# Patient Record
Sex: Male | Born: 1980 | Race: Black or African American | Hispanic: No | Marital: Married | Smoking: Never smoker
Health system: Southern US, Community
[De-identification: ages and names within clinical notes are randomized; demographics above are authoritative.]

## PROBLEM LIST (undated history)

## (undated) DIAGNOSIS — J45909 Unspecified asthma, uncomplicated: Secondary | ICD-10-CM

## (undated) DIAGNOSIS — I10 Essential (primary) hypertension: Secondary | ICD-10-CM

## (undated) DIAGNOSIS — M1990 Unspecified osteoarthritis, unspecified site: Secondary | ICD-10-CM

## (undated) HISTORY — PX: OTHER SURGICAL HISTORY: SHX169

---

## 2001-05-30 ENCOUNTER — Inpatient Hospital Stay (HOSPITAL_COMMUNITY): Admission: EM | Admit: 2001-05-30 | Discharge: 2001-06-02 | Payer: Self-pay | Admitting: Emergency Medicine

## 2001-05-30 ENCOUNTER — Encounter (INDEPENDENT_AMBULATORY_CARE_PROVIDER_SITE_OTHER): Payer: Self-pay | Admitting: *Deleted

## 2001-05-30 ENCOUNTER — Encounter: Payer: Self-pay | Admitting: Emergency Medicine

## 2001-08-09 ENCOUNTER — Encounter: Admission: RE | Admit: 2001-08-09 | Discharge: 2001-08-09 | Payer: Self-pay | Admitting: Neurosurgery

## 2001-08-09 ENCOUNTER — Encounter: Payer: Self-pay | Admitting: Neurosurgery

## 2004-10-04 ENCOUNTER — Emergency Department (HOSPITAL_COMMUNITY): Admission: EM | Admit: 2004-10-04 | Discharge: 2004-10-05 | Payer: Self-pay | Admitting: Emergency Medicine

## 2004-10-06 ENCOUNTER — Emergency Department (HOSPITAL_COMMUNITY): Admission: EM | Admit: 2004-10-06 | Discharge: 2004-10-06 | Payer: Self-pay | Admitting: Emergency Medicine

## 2007-04-06 ENCOUNTER — Ambulatory Visit (HOSPITAL_COMMUNITY): Admission: EM | Admit: 2007-04-06 | Discharge: 2007-04-06 | Payer: Self-pay | Admitting: Emergency Medicine

## 2007-12-14 ENCOUNTER — Emergency Department (HOSPITAL_COMMUNITY): Admission: EM | Admit: 2007-12-14 | Discharge: 2007-12-14 | Payer: Self-pay | Admitting: Emergency Medicine

## 2011-02-04 ENCOUNTER — Emergency Department (HOSPITAL_COMMUNITY)
Admission: EM | Admit: 2011-02-04 | Discharge: 2011-02-04 | Disposition: A | Discharge status: Home / Self Care | Payer: Self-pay | Attending: Emergency Medicine | Admitting: Emergency Medicine

## 2011-02-04 ENCOUNTER — Emergency Department (HOSPITAL_COMMUNITY): Payer: Self-pay

## 2011-02-04 DIAGNOSIS — S9030XA Contusion of unspecified foot, initial encounter: Secondary | ICD-10-CM | POA: Insufficient documentation

## 2011-02-04 DIAGNOSIS — W2209XA Striking against other stationary object, initial encounter: Secondary | ICD-10-CM | POA: Insufficient documentation

## 2011-02-04 DIAGNOSIS — Y929 Unspecified place or not applicable: Secondary | ICD-10-CM | POA: Insufficient documentation
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
## 2011-08-10 LAB — BASIC METABOLIC PANEL
BUN: 9
Calcium: 9
Creatinine, Ser: 1.19
GFR calc Af Amer: >60

## 2011-08-10 LAB — URINALYSIS, ROUTINE W REFLEX MICROSCOPIC
Hgb urine dipstick: NEGATIVE
Protein, ur: NEGATIVE
Specific Gravity, Urine: 1.021
Urobilinogen, UA: 0.2

## 2011-08-10 LAB — ETHANOL: Alcohol, Ethyl (B): 163 — ABNORMAL HIGH

## 2011-08-10 LAB — RAPID URINE DRUG SCREEN, HOSP PERFORMED
Amphetamines: NOT DETECTED
Barbiturates: NOT DETECTED
Opiates: NOT DETECTED

## 2011-08-10 LAB — CBC
MCHC: 33.5
Platelets: 307
RBC: 5.29
WBC: 6.6

## 2011-08-10 LAB — DIFFERENTIAL
Basophils Relative: 0
Lymphs Abs: 1.3
Monocytes Relative: 7
Neutro Abs: 4.7
Neutrophils Relative %: 72

## 2013-10-09 ENCOUNTER — Ambulatory Visit: Payer: Self-pay

## 2015-12-24 ENCOUNTER — Emergency Department (HOSPITAL_COMMUNITY): Payer: Self-pay

## 2015-12-24 ENCOUNTER — Emergency Department (HOSPITAL_COMMUNITY)
Admission: EM | Admit: 2015-12-24 | Discharge: 2015-12-24 | Disposition: A | Discharge status: Home / Self Care | Payer: Self-pay | Attending: Emergency Medicine | Admitting: Emergency Medicine

## 2015-12-24 ENCOUNTER — Encounter (HOSPITAL_COMMUNITY): Payer: Self-pay | Admitting: Emergency Medicine

## 2015-12-24 DIAGNOSIS — Z79899 Other long term (current) drug therapy: Secondary | ICD-10-CM | POA: Insufficient documentation

## 2015-12-24 DIAGNOSIS — J45901 Unspecified asthma with (acute) exacerbation: Secondary | ICD-10-CM | POA: Insufficient documentation

## 2015-12-24 DIAGNOSIS — Z87891 Personal history of nicotine dependence: Secondary | ICD-10-CM | POA: Insufficient documentation

## 2015-12-24 LAB — I-STAT CHEM 8, ED
BUN: 15 mg/dL (ref 6–20)
CREATININE: 0.9 mg/dL (ref 0.61–1.24)
Calcium, Ion: 1.08 mmol/L — ABNORMAL LOW (ref 1.12–1.23)
Chloride: 104 mmol/L (ref 101–111)
GLUCOSE: 103 mg/dL — AB (ref 65–99)
HCT: 48 % (ref 39.0–52.0)
HEMOGLOBIN: 16.3 g/dL (ref 13.0–17.0)
Potassium: 3.5 mmol/L (ref 3.5–5.1)
Sodium: 141 mmol/L (ref 135–145)
TCO2: 23 mmol/L (ref 0–100)

## 2015-12-24 LAB — CBC WITH DIFFERENTIAL/PLATELET
Basophils Absolute: 0 10*3/uL (ref 0.0–0.1)
Basophils Relative: 1 %
Eosinophils Absolute: 0.6 10*3/uL (ref 0.0–0.7)
Eosinophils Relative: 11 %
HEMATOCRIT: 46.1 % (ref 39.0–52.0)
HEMOGLOBIN: 15.9 g/dL (ref 13.0–17.0)
LYMPHS ABS: 1.2 10*3/uL (ref 0.7–4.0)
LYMPHS PCT: 20 %
MCH: 30.1 pg (ref 26.0–34.0)
MCHC: 34.5 g/dL (ref 30.0–36.0)
MCV: 87.3 fL (ref 78.0–100.0)
MONO ABS: 0.9 10*3/uL (ref 0.1–1.0)
MONOS PCT: 16 %
NEUTROS ABS: 3 10*3/uL (ref 1.7–7.7)
Neutrophils Relative %: 52 %
Platelets: 230 10*3/uL (ref 150–400)
RBC: 5.28 MIL/uL (ref 4.22–5.81)
RDW: 13 % (ref 11.5–15.5)
WBC: 5.7 10*3/uL (ref 4.0–10.5)

## 2015-12-24 LAB — I-STAT TROPONIN, ED: Troponin i, poc: 0 ng/mL (ref 0.00–0.08)

## 2015-12-24 MED ORDER — ALBUTEROL SULFATE HFA 108 (90 BASE) MCG/ACT IN AERS: 2.0000 | INHALATION_SPRAY | Freq: Four times a day (QID) | RESPIRATORY_TRACT | Status: DC | PRN

## 2015-12-24 MED ORDER — ALBUTEROL SULFATE (2.5 MG/3ML) 0.083% IN NEBU
5.0000 mg | INHALATION_SOLUTION | Freq: Once | RESPIRATORY_TRACT | Status: AC
  Administered 2015-12-24: 5 mg via RESPIRATORY_TRACT
  Filled 2015-12-24: qty 6

## 2015-12-24 MED ORDER — HYDROCHLOROTHIAZIDE 12.5 MG PO CAPS: 12.5000 mg | ORAL_CAPSULE | Freq: Every day | ORAL | Status: DC

## 2015-12-24 MED ORDER — METHYLPREDNISOLONE SODIUM SUCC 125 MG IJ SOLR
125.0000 mg | Freq: Once | INTRAMUSCULAR | Status: DC
  Filled 2015-12-24: qty 2

## 2015-12-24 MED ORDER — PREDNISONE 20 MG PO TABS
60.0000 mg | ORAL_TABLET | Freq: Once | ORAL | Status: AC
  Administered 2015-12-24: 60 mg via ORAL
  Filled 2015-12-24: qty 3

## 2015-12-24 MED ORDER — ALBUTEROL SULFATE HFA 108 (90 BASE) MCG/ACT IN AERS
2.0000 | INHALATION_SPRAY | Freq: Four times a day (QID) | RESPIRATORY_TRACT | Status: DC | PRN
  Administered 2015-12-24: 2 via RESPIRATORY_TRACT
  Filled 2015-12-24: qty 6.7

## 2015-12-24 MED ORDER — PREDNISONE 50 MG PO TABS: ORAL_TABLET | ORAL | Status: DC

## 2015-12-24 MED ORDER — IPRATROPIUM BROMIDE 0.02 % IN SOLN
0.5000 mg | Freq: Once | RESPIRATORY_TRACT | Status: AC
  Administered 2015-12-24: 0.5 mg via RESPIRATORY_TRACT
  Filled 2015-12-24: qty 2.5

## 2015-12-24 NOTE — Discharge Instructions (Signed)
You were seen for a likely asthma exacerbation You will be prescribed albuterol to use as needed for shortness of breath and prednisone to take for a total of 5 days Please follow up with a PCP. You were given the information for the [facility] office You were also noted to have elevated blood pressure concerning for hypertension. You were started on Hydrochlorothiazide. Please take this daily. If you develop dizziness or headache after taking this medication, please stop it and follow with a PCP. This could mean your blood pressure is too low If you have worsening shortness of breath or wheezing, please return to the ED for evaluation

## 2015-12-24 NOTE — ED Notes (Signed)
Patient with URI for the last few days, patient states that he woke up with chest pain and shortness of breath.  Patient with audible wheezing, cough, nonproductive in nature.  Patient states that he has been wheezing for the last few days.  He has been using his son's nebs with some relief of the shortness of breath.

## 2015-12-24 NOTE — ED Provider Notes (Signed)
CSN: [id number]     Arrival date & time 12/24/15  0455 History   First MD Initiated Contact with Patient 12/24/15 [phone]     Chief Complaint  Patient presents with  . Chest Pain  . Shortness of Breath    HPI  35 y/o male presenting with non productive cough, shortness of breath for the last 4 days acutely worsening this AM This morning he awoke from sleep with difficlty breathing. He used his son's albuterol nebulizer which helped enough to stabilize him to come to the ED History noted anterior chest wall pain with deep inspiration and coughing during this time period as well. Pain occasionally radiates to back. His son was treated for the Flu approximately 3 weeks ago.  He denies a history of asthma but his mother and son both have asthma. He occasionally uses his son's albuterol    Denies fevers, chills, nausea, vomiting, diarrhea, abdominal pain.    History reviewed. No pertinent past medical history. Past Surgical History  Procedure Laterality Date  . Skull surgery     No family history on file. Social History  Substance Use Topics  . Smoking status: Former Games developer  . Smokeless tobacco: None  . Alcohol Use: Yes    Review of Systems Her HPI  Allergies  Review of patient's allergies indicates no known allergies.  Home Medications   Prior to Admission medications   Medication Sig Start Date End Date Taking? Authorizing Provider  albuterol (PROVENTIL) (2.5 MG/3ML) 0.083% nebulizer solution Take 2.5 mg by nebulization every 6 (six) hours as needed for wheezing or shortness of breath.   Yes Historical Provider, MD  Phenyleph-CPM-DM-APAP (ALKA-SELTZER PLUS COLD & COUGH) 02-21-09-325 MG CAPS Take 2 capsules by mouth daily as needed (cold).   Yes Historical Provider, MD  albuterol (PROVENTIL HFA;VENTOLIN HFA) 108 (90 Base) MCG/ACT inhaler Inhale 2 puffs into the lungs every 6 (six) hours as needed for wheezing or shortness of breath. 12/24/15   Chelsei Mcchesney A Kennon Rounds, MD  hydrochlorothiazide  (MICROZIDE) 12.5 MG capsule Take 1 capsule (12.5 mg total) by mouth daily. 12/24/15   Bonney Aid, MD  predniSONE (DELTASONE) 50 MG tablet Take daily for next 4 days 12/24/15   Markasia Carrol A Kashina Mecum, MD   BP 142/108 mmHg  Pulse 71  Temp(Src) 98.3 F (36.8 C) (Oral)  Resp 13  SpO2 97% Physical Exam  Constitutional: He appears well-developed and well-nourished.  HENT:  Head: Normocephalic.  Eyes: EOM are normal. Pupils are equal, round, and reactive to light.  Neck: Normal range of motion.  Cardiovascular: Normal rate, regular rhythm and normal heart sounds.   Pulmonary/Chest: Effort normal. No respiratory distress. He has wheezes. He has rales. He exhibits no tenderness.  Abdominal: Soft. Bowel sounds are normal. He exhibits no distension. There is no tenderness.  Neurological: He is alert.    ED Course  Procedures (including critical care time) Labs Review Labs Reviewed  I-STAT CHEM 8, ED - Abnormal; Notable for the following:    Glucose, Bld 103 (*)    Calcium, Ion 1.08 (*)    All other components within normal limits  CBC WITH DIFFERENTIAL/PLATELET  Rosezena Sensor, ED    Imaging Review Dg Chest 2 View  12/24/2015  CLINICAL DATA:  Acute onset of generalized chest pain, shortness of breath and wheezing. Cough. Initial encounter. EXAM: CHEST  2 VIEW COMPARISON:  Chest radiograph performed 12/14/2007 FINDINGS: The lungs are well-aerated and clear. There is no evidence of focal opacification, pleural effusion or pneumothorax.  The heart is normal in size; the mediastinal contour is within normal limits. No acute osseous abnormalities are seen. IMPRESSION: No acute cardiopulmonary process seen. Electronically Signed   By: Roanna RaiderJeffery  Chang M.D.   On: 12/24/2015 05:44   I have personally reviewed and evaluated these images and lab results as part of my medical decision-making.   EKG Interpretation   Date/Time:  Friday December 24 2015 05:03:17 EST Ventricular Rate:  72 PR Interval:  152 QRS  Duration: 92 QT Interval:  382 QTC Calculation: 418 R Axis:   81 Text Interpretation:  Sinus rhythm ST elev, probable normal early repol  pattern Confirmed by ZAVITZ  MD, JOSHUA ([id number]) on 12/24/2015 6:11:58 AM      MDM   Final diagnoses:  None   Shortness of breath and cough likely due to an acute asthma exacerbation in setting of potential viral URI. Chest XR negative for consolidation, afebrile making acute bacterial process less likely. Shortness of breath improved with albuterol, duonebs in the ED and prednisone. He was felt to be stable for discharge ad was sent with a prescription for prednisone to complete a 5 day course as well as albuterol.  His chest pain is likely musculoskeletal in nature and due to coughing. EKG was negative for evidence of ischemia and troponins were negative.  Blood pressure was noted to be elevated in the ED to a max to systolic 158 and diastolic 108. He had no symptoms or evidence of end organ damage. He was started on hydrochlorothiazide and counseled to follow up with a primary care provider.  Return precaution reviewed    Levante Simones A. Kennon RoundsHaney MD, MS Family Medicine Resident PGY-2 Pager [phone]     Bonney AidAlyssa A Tyne Banta, MD 12/24/15 [id number]  Blane OharaJoshua Zavitz, MD 12/25/15 0001

## 2017-04-13 ENCOUNTER — Emergency Department (HOSPITAL_COMMUNITY): Payer: Self-pay

## 2017-04-13 ENCOUNTER — Encounter (HOSPITAL_COMMUNITY): Payer: Self-pay

## 2017-04-13 ENCOUNTER — Emergency Department (HOSPITAL_COMMUNITY)
Admission: EM | Admit: 2017-04-13 | Discharge: 2017-04-14 | Disposition: A | Discharge status: Home / Self Care | Payer: Self-pay | Attending: Emergency Medicine | Admitting: Emergency Medicine

## 2017-04-13 DIAGNOSIS — Z79899 Other long term (current) drug therapy: Secondary | ICD-10-CM | POA: Insufficient documentation

## 2017-04-13 DIAGNOSIS — J4521 Mild intermittent asthma with (acute) exacerbation: Secondary | ICD-10-CM | POA: Insufficient documentation

## 2017-04-13 MED ORDER — ALBUTEROL SULFATE (2.5 MG/3ML) 0.083% IN NEBU
5.0000 mg | INHALATION_SOLUTION | Freq: Once | RESPIRATORY_TRACT | Status: AC
  Administered 2017-04-13: 5 mg via RESPIRATORY_TRACT

## 2017-04-13 MED ORDER — ALBUTEROL SULFATE (2.5 MG/3ML) 0.083% IN NEBU
INHALATION_SOLUTION | RESPIRATORY_TRACT | Status: AC
  Filled 2017-04-13: qty 6

## 2017-04-13 NOTE — ED Triage Notes (Signed)
Pt complaining of SOB and chest tightness. Pt states cough x 1 day. Pt denies any hx of asthma or COPD.

## 2017-04-14 LAB — CBC WITH DIFFERENTIAL/PLATELET
Basophils Absolute: 0 10*3/uL (ref 0.0–0.1)
Basophils Relative: 0 %
Eosinophils Absolute: 0 10*3/uL (ref 0.0–0.7)
Eosinophils Relative: 0 %
HEMATOCRIT: 49.6 % (ref 39.0–52.0)
HEMOGLOBIN: 16.8 g/dL (ref 13.0–17.0)
LYMPHS ABS: 0.5 10*3/uL — AB (ref 0.7–4.0)
Lymphocytes Relative: 5 %
MCH: 30.2 pg (ref 26.0–34.0)
MCHC: 33.9 g/dL (ref 30.0–36.0)
MCV: 89 fL (ref 78.0–100.0)
MONOS PCT: 3 %
Monocytes Absolute: 0.3 10*3/uL (ref 0.1–1.0)
NEUTROS ABS: 8.8 10*3/uL — AB (ref 1.7–7.7)
NEUTROS PCT: 92 %
Platelets: 232 10*3/uL (ref 150–400)
RBC: 5.57 MIL/uL (ref 4.22–5.81)
RDW: 12.9 % (ref 11.5–15.5)
WBC: 9.6 10*3/uL (ref 4.0–10.5)

## 2017-04-14 LAB — I-STAT TROPONIN, ED: TROPONIN I, POC: 0 ng/mL (ref 0.00–0.08)

## 2017-04-14 LAB — BASIC METABOLIC PANEL
Anion gap: 11 (ref 5–15)
BUN: 12 mg/dL (ref 6–20)
CHLORIDE: 100 mmol/L — AB (ref 101–111)
CO2: 22 mmol/L (ref 22–32)
CREATININE: 1.13 mg/dL (ref 0.61–1.24)
Calcium: 9.5 mg/dL (ref 8.9–10.3)
GFR calc non Af Amer: >60 mL/min (ref >60)
Glucose, Bld: 118 mg/dL — ABNORMAL HIGH (ref 65–99)
Potassium: 4.1 mmol/L (ref 3.5–5.1)
Sodium: 133 mmol/L — ABNORMAL LOW (ref 135–145)

## 2017-04-14 LAB — D-DIMER, QUANTITATIVE: D-Dimer, Quant: <0.27 ug/mL-FEU (ref 0.00–0.50)

## 2017-04-14 MED ORDER — PREDNISONE 20 MG PO TABS
60.0000 mg | ORAL_TABLET | Freq: Once | ORAL | Status: AC
  Administered 2017-04-14: 60 mg via ORAL
  Filled 2017-04-14: qty 3

## 2017-04-14 MED ORDER — ALBUTEROL SULFATE HFA 108 (90 BASE) MCG/ACT IN AERS
2.0000 | INHALATION_SPRAY | Freq: Once | RESPIRATORY_TRACT | Status: AC
  Administered 2017-04-14: 2 via RESPIRATORY_TRACT
  Filled 2017-04-14: qty 6.7

## 2017-04-14 MED ORDER — ACETAMINOPHEN 325 MG PO TABS
650.0000 mg | ORAL_TABLET | Freq: Once | ORAL | Status: AC
  Administered 2017-04-14: 650 mg via ORAL
  Filled 2017-04-14: qty 2

## 2017-04-14 MED ORDER — PREDNISONE 20 MG PO TABS: 40.0000 mg | ORAL_TABLET | Freq: Every day | ORAL | 0 refills | Status: DC

## 2017-04-14 NOTE — ED Provider Notes (Signed)
[facility]-EMERGENCY DEPT Provider Note   CSN: [id number] Arrival date & time: 04/13/17  2033     History   Chief Complaint Chief Complaint  Patient presents with  . Shortness of Breath    HPI William [name] is a 36 y.o. male.  HPI  36 year old male with a history of prior asthma presents with chest tightness and shortness of breath. He states he's had on and off short of breath for about one month. However the chest tightness and shortness of breath today were much more severe since this morning. He has a cough whenever he takes a deep breath. Was given a breathing treatment in the waiting room and states that short of breath is better but he still has some residual pain in the middle this chest. He denies any recent coughing besides with deep breath, no recent travel, surgery, or history of PE or DVT. No leg swelling or pain. He does not have an inhaler at home. Feels somewhat like an asthma exacerbation he had to come to the ER for last year.  History reviewed. No pertinent past medical history.  There are no active problems to display for this patient.   Past Surgical History:  Procedure Laterality Date  . skull surgery         Home Medications    Prior to Admission medications   Medication Sig Start Date End Date Taking? Authorizing Provider  albuterol (PROVENTIL HFA;VENTOLIN HFA) 108 (90 Base) MCG/ACT inhaler Inhale 2 puffs into the lungs every 6 (six) hours as needed for wheezing or shortness of breath. 12/24/15   Haney, Arlyss RepressAlyssa A, MD  albuterol (PROVENTIL) (2.5 MG/3ML) 0.083% nebulizer solution Take 2.5 mg by nebulization every 6 (six) hours as needed for wheezing or shortness of breath.    [provider]  hydrochlorothiazide (MICROZIDE) 12.5 MG capsule Take 1 capsule (12.5 mg total) by mouth daily. 12/24/15   Haney, Jeanann LewandowskyAlyssa A, MD  Phenyleph-CPM-DM-APAP (ALKA-SELTZER PLUS COLD & COUGH) 02-21-09-325 MG CAPS Take 2 capsules by mouth daily as needed (cold).    [provider]  predniSONE (DELTASONE) 50 MG tablet Take daily for next 4 days 12/24/15   Bonney AidHaney, Alyssa A, MD    Family History History reviewed. No pertinent family history.  Social History Social History  Substance Use Topics  . Smoking status: Former Games developermoker  . Smokeless tobacco: Never Used  . Alcohol use Yes     Allergies   Patient has no known allergies.   Review of Systems Review of Systems  Constitutional: Negative for fever.  Respiratory: Positive for cough, chest tightness and shortness of breath.   Cardiovascular: Negative for leg swelling.  All other systems reviewed and are negative.    Physical Exam Updated Vital Signs BP (!) 152/99   Pulse 93   Temp 99.3 F (37.4 C) (Oral)   Resp (!) 25   SpO2 94%   Physical Exam  Constitutional: He is oriented to person, place, and time. He appears well-developed and well-nourished. No distress.  HENT:  Head: Normocephalic and atraumatic.  Right Ear: External ear normal.  Left Ear: External ear normal.  Nose: Nose normal.  Eyes: Right eye exhibits no discharge. Left eye exhibits no discharge.  Neck: Neck supple.  Cardiovascular: Normal rate, regular rhythm and normal heart sounds.   Pulmonary/Chest: Effort normal and breath sounds normal. He has no wheezes. He exhibits no tenderness.  Abdominal: Soft. There is no tenderness.  Musculoskeletal: He exhibits no edema.  Neurological: He is  alert and oriented to person, place, and time.  Skin: Skin is warm and dry. He is not diaphoretic.  Nursing note and vitals reviewed.    ED Treatments / Results  Labs (all labs ordered are listed, but only abnormal results are displayed) Labs Reviewed  BASIC METABOLIC PANEL - Abnormal; Notable for the following:       Result Value   Sodium 133 (*)    Chloride 100 (*)    Glucose, Bld 118 (*)    All other components within normal limits  CBC WITH DIFFERENTIAL/PLATELET - Abnormal; Notable for the following:    Neutro Abs 8.8  (*)    Lymphs Abs 0.5 (*)    All other components within normal limits  D-DIMER, QUANTITATIVE (NOT AT [facility])  Rosezena Sensor, ED    EKG  EKG Interpretation  Date/Time:  Friday April 13 2017 20:57:45 EDT Ventricular Rate:  120 PR Interval:  134 QRS Duration: 76 QT Interval:  306 QTC Calculation: 432 R Axis:   91 Text Interpretation:  Sinus tachycardia Biatrial enlargement Rightward axis Pulmonary disease pattern Abnormal ECG rate is faster compared to 2017 Confirmed by Pricilla Loveless [phone]) on 04/14/2017 12:08:37 AM       Radiology Dg Chest 2 View  Result Date: 04/13/2017 CLINICAL DATA:  36 year old male with shortness of breath and cough. EXAM: CHEST  2 VIEW COMPARISON:  Chest radiograph dated 12/24/2015 FINDINGS: The heart size and mediastinal contours are within normal limits. Both lungs are clear. The visualized skeletal structures are unremarkable. IMPRESSION: No active cardiopulmonary disease. Electronically Signed   By: Elgie Collard M.D.   On: 04/13/2017 21:51    Procedures Procedures (including critical care time)  Medications Ordered in ED Medications  albuterol (PROVENTIL HFA;VENTOLIN HFA) 108 (90 Base) MCG/ACT inhaler 2 puff (not administered)  acetaminophen (TYLENOL) tablet 650 mg (not administered)  albuterol (PROVENTIL) (2.5 MG/3ML) 0.083% nebulizer solution 5 mg (5 mg Nebulization Given 04/13/17 2042)     Initial Impression / Assessment and Plan / ED Course  I have reviewed the triage vital signs and the nursing notes.  Pertinent labs & imaging results that were available during my care of the patient were reviewed by me and considered in my medical decision making (see chart for details).     Symptoms have resolved after tylenol/albuterol. Will give steroid burst. Most likely URI/asthma. Well appearing at this time. No wheezing on my exam but was already treated in triage. Workup otherwise unremarkable, no signs of PE, ACS, etc. F/u with PCP. Discussed  return precautions.  Final Clinical Impressions(s) / ED Diagnoses   Final diagnoses:  Mild intermittent asthma with exacerbation    New Prescriptions New Prescriptions   No medications on file     Pricilla Loveless, MD 04/14/17 1003

## 2017-08-22 ENCOUNTER — Emergency Department (HOSPITAL_COMMUNITY): Payer: Medicaid Other

## 2017-08-22 ENCOUNTER — Encounter (HOSPITAL_COMMUNITY): Payer: Self-pay | Admitting: *Deleted

## 2017-08-22 ENCOUNTER — Emergency Department (HOSPITAL_COMMUNITY)
Admission: EM | Admit: 2017-08-22 | Discharge: 2017-08-22 | Disposition: A | Discharge status: Home / Self Care | Payer: Medicaid Other | Attending: Emergency Medicine | Admitting: Emergency Medicine

## 2017-08-22 DIAGNOSIS — J45901 Unspecified asthma with (acute) exacerbation: Secondary | ICD-10-CM | POA: Diagnosis not present

## 2017-08-22 DIAGNOSIS — Z87891 Personal history of nicotine dependence: Secondary | ICD-10-CM | POA: Diagnosis not present

## 2017-08-22 DIAGNOSIS — R0602 Shortness of breath: Secondary | ICD-10-CM | POA: Diagnosis present

## 2017-08-22 LAB — CBC WITH DIFFERENTIAL/PLATELET
BASOS PCT: 1 %
Basophils Absolute: 0.1 10*3/uL (ref 0.0–0.1)
EOS PCT: 10 %
Eosinophils Absolute: 0.8 10*3/uL — ABNORMAL HIGH (ref 0.0–0.7)
HEMATOCRIT: 51 % (ref 39.0–52.0)
Hemoglobin: 17.9 g/dL — ABNORMAL HIGH (ref 13.0–17.0)
Lymphocytes Relative: 27 %
Lymphs Abs: 2.2 10*3/uL (ref 0.7–4.0)
MCH: 30.6 pg (ref 26.0–34.0)
MCHC: 35.1 g/dL (ref 30.0–36.0)
MCV: 87.2 fL (ref 78.0–100.0)
MONO ABS: 0.8 10*3/uL (ref 0.1–1.0)
MONOS PCT: 9 %
NEUTROS ABS: 4.5 10*3/uL (ref 1.7–7.7)
Neutrophils Relative %: 53 %
Platelets: 297 10*3/uL (ref 150–400)
RBC: 5.85 MIL/uL — ABNORMAL HIGH (ref 4.22–5.81)
RDW: 12.7 % (ref 11.5–15.5)
WBC: 8.3 10*3/uL (ref 4.0–10.5)

## 2017-08-22 LAB — BASIC METABOLIC PANEL
ANION GAP: 11 (ref 5–15)
BUN: 13 mg/dL (ref 6–20)
CALCIUM: 9.3 mg/dL (ref 8.9–10.3)
CO2: 23 mmol/L (ref 22–32)
Chloride: 103 mmol/L (ref 101–111)
Creatinine, Ser: 1.05 mg/dL (ref 0.61–1.24)
GFR calc Af Amer: >60 mL/min (ref >60)
GLUCOSE: 137 mg/dL — AB (ref 65–99)
Potassium: 3.9 mmol/L (ref 3.5–5.1)
Sodium: 137 mmol/L (ref 135–145)

## 2017-08-22 LAB — D-DIMER, QUANTITATIVE: D-Dimer, Quant: <0.27 ug/mL-FEU (ref 0.00–0.50)

## 2017-08-22 LAB — POCT I-STAT TROPONIN I: TROPONIN I, POC: 0 ng/mL (ref 0.00–0.08)

## 2017-08-22 MED ORDER — BENZONATATE 100 MG PO CAPS: 200.0000 mg | ORAL_CAPSULE | Freq: Three times a day (TID) | ORAL | 0 refills | Status: DC

## 2017-08-22 MED ORDER — IPRATROPIUM-ALBUTEROL 0.5-2.5 (3) MG/3ML IN SOLN
3.0000 mL | Freq: Once | RESPIRATORY_TRACT | Status: AC
  Administered 2017-08-22: 3 mL via RESPIRATORY_TRACT
  Filled 2017-08-22: qty 3

## 2017-08-22 MED ORDER — ACETAMINOPHEN 325 MG PO TABS
650.0000 mg | ORAL_TABLET | Freq: Once | ORAL | Status: AC
  Administered 2017-08-22: 650 mg via ORAL
  Filled 2017-08-22: qty 2

## 2017-08-22 MED ORDER — PREDNISONE 20 MG PO TABS
40.0000 mg | ORAL_TABLET | Freq: Once | ORAL | Status: AC
  Administered 2017-08-22: 40 mg via ORAL
  Filled 2017-08-22: qty 2

## 2017-08-22 MED ORDER — ALBUTEROL SULFATE (2.5 MG/3ML) 0.083% IN NEBU: 2.5000 mg | INHALATION_SOLUTION | Freq: Four times a day (QID) | RESPIRATORY_TRACT | 12 refills | Status: DC | PRN

## 2017-08-22 MED ORDER — ALBUTEROL SULFATE HFA 108 (90 BASE) MCG/ACT IN AERS
1.0000 | INHALATION_SPRAY | Freq: Once | RESPIRATORY_TRACT | Status: AC
  Administered 2017-08-22: 1 via RESPIRATORY_TRACT
  Filled 2017-08-22: qty 6.7

## 2017-08-22 MED ORDER — HYDROCHLOROTHIAZIDE 12.5 MG PO CAPS: 12.5000 mg | ORAL_CAPSULE | Freq: Every day | ORAL | 5 refills | Status: DC

## 2017-08-22 MED ORDER — ALBUTEROL SULFATE HFA 108 (90 BASE) MCG/ACT IN AERS: 1.0000 | INHALATION_SPRAY | Freq: Four times a day (QID) | RESPIRATORY_TRACT | 0 refills | Status: DC | PRN

## 2017-08-22 MED ORDER — ALBUTEROL SULFATE (2.5 MG/3ML) 0.083% IN NEBU
5.0000 mg | INHALATION_SOLUTION | Freq: Once | RESPIRATORY_TRACT | Status: AC
  Administered 2017-08-22: 5 mg via RESPIRATORY_TRACT
  Filled 2017-08-22: qty 6

## 2017-08-22 MED ORDER — PREDNISONE 10 MG PO TABS: 40.0000 mg | ORAL_TABLET | Freq: Every day | ORAL | 0 refills | Status: AC

## 2017-08-22 NOTE — ED Provider Notes (Signed)
[facility]-EMERGENCY DEPT Provider Note   CSN: [id number] Arrival date & time: 08/22/17  1312     History   Chief Complaint Chief Complaint  Patient presents with  . Shortness of Breath    HPI William [name] is a 36 y.o. male with a past medical history of asthma, presents to ED for evaluation of intermittent chest pain, shortness of breath, dry cough, wheezing for the past week. States that the chest pain is located all throughout his chest and worse with deep breathing. Described as sharp. This feels similar to his prior asthma exacerbations.  He states that he ran out of his albuterol inhaler several weeks ago.  She has not tried any over-the-counter medications to help.  He denies any hemoptysis, leg swelling, recent surgery, history of cancer, prior MI, DVT, PE, nausea, vomiting, abdominal pain, productive cough, fever or sick contacts.  HPI  History reviewed. No pertinent past medical history.  There are no active problems to display for this patient.   Past Surgical History:  Procedure Laterality Date  . skull surgery         Home Medications    Prior to Admission medications   Medication Sig Start Date End Date Taking? Authorizing Provider  Dextromethorphan Polistirex (ROBITUSSIN 12 HOUR COUGH PO) Take 30 mLs by mouth at bedtime as needed (cold symptoms).   Yes [provider]  albuterol (PROVENTIL HFA;VENTOLIN HFA) 108 (90 Base) MCG/ACT inhaler Inhale 1-2 puffs into the lungs every 6 (six) hours as needed for wheezing or shortness of breath. 08/22/17   Allayah Raineri, PA-C  albuterol (PROVENTIL) (2.5 MG/3ML) 0.083% nebulizer solution Take 3 mLs (2.5 mg total) by nebulization every 6 (six) hours as needed for wheezing or shortness of breath. 08/22/17   Siona Coulston, PA-C  benzonatate (TESSALON) 100 MG capsule Take 2 capsules (200 mg total) by mouth every 8 (eight) hours. 08/22/17   Shrinika Blatz, PA-C  hydrochlorothiazide (MICROZIDE) 12.5  MG capsule Take 1 capsule (12.5 mg total) by mouth daily. 08/22/17   Aditya Nastasi, PA-C  Phenyleph-CPM-DM-APAP (ALKA-SELTZER PLUS COLD & COUGH) 02-21-09-325 MG CAPS Take 2 capsules by mouth daily as needed (cold).    [provider]  predniSONE (DELTASONE) 10 MG tablet Take 4 tablets (40 mg total) by mouth daily. 08/22/17 08/26/17  Dietrich PatesKhatri, Jarelis Ehlert, PA-C    Family History No family history on file.  Social History Social History  Substance Use Topics  . Smoking status: Former Games developermoker  . Smokeless tobacco: Never Used  . Alcohol use Yes     Allergies   Patient has no known allergies.   Review of Systems Review of Systems  Constitutional: Negative for appetite change, chills and fever.  HENT: Positive for congestion and sore throat. Negative for ear pain, rhinorrhea and sneezing.   Eyes: Negative for photophobia and visual disturbance.  Respiratory: Positive for cough, chest tightness and shortness of breath. Negative for wheezing.   Cardiovascular: Positive for chest pain. Negative for palpitations.  Gastrointestinal: Negative for abdominal pain, blood in stool, constipation, diarrhea, nausea and vomiting.  Genitourinary: Negative for dysuria, hematuria and urgency.  Musculoskeletal: Negative for myalgias.  Skin: Negative for rash.  Neurological: Negative for dizziness, weakness and light-headedness.     Physical Exam Updated Vital Signs BP (!) 162/112 (BP Location: Right Arm)   Pulse 74   Temp 97.9 F (36.6 C) (Oral)   Resp 17   SpO2 95%   Physical Exam  Constitutional: He appears well-developed and well-nourished.  No distress.  Nontoxic-appearing and in no acute distress.  Does appear uncomfortable.  Able to speak in complete sentences.  HENT:  Head: Normocephalic and atraumatic.  Nose: Nose normal.  Mouth/Throat: Uvula is midline. No tonsillar exudate.  Patient does not appear to be in acute distress. No trismus or drooling present. No pooling of secretions.  Patient is tolerating secretions and is not in respiratory distress. No neck pain or tenderness to palpation of the neck. Full active and passive range of motion of the neck. No evidence of RPA or PTA.  Eyes: Conjunctivae and EOM are normal. Right eye exhibits no discharge. Left eye exhibits no discharge. No scleral icterus.  Neck: Normal range of motion. Neck supple.  Cardiovascular: Regular rhythm, normal heart sounds and intact distal pulses.  Tachycardia present.  Exam reveals no gallop and no friction rub.   No murmur heard. Pulmonary/Chest: Effort normal. No respiratory distress. He has wheezes in the right upper field, the right middle field, the right lower field, the left upper field, the left middle field and the left lower field.  Abdominal: Soft. Bowel sounds are normal. He exhibits no distension. There is no tenderness. There is no guarding.  Musculoskeletal: Normal range of motion. He exhibits no edema.  Neurological: He is alert. He exhibits normal muscle tone. Coordination normal.  Skin: Skin is warm and dry. No rash noted.  Psychiatric: He has a normal mood and affect.  Nursing note and vitals reviewed.    ED Treatments / Results  Labs (all labs ordered are listed, but only abnormal results are displayed) Labs Reviewed  BASIC METABOLIC PANEL - Abnormal; Notable for the following:       Result Value   Glucose, Bld 137 (*)    All other components within normal limits  CBC WITH DIFFERENTIAL/PLATELET - Abnormal; Notable for the following:    RBC 5.85 (*)    Hemoglobin 17.9 (*)    Eosinophils Absolute 0.8 (*)    All other components within normal limits  D-DIMER, QUANTITATIVE (NOT AT [facility])  I-STAT TROPONIN, ED  POCT I-STAT TROPONIN I    EKG  EKG Interpretation  Date/Time:  Wednesday August 22 2017 13:20:55 EDT Ventricular Rate:  88 PR Interval:    QRS Duration: 82 QT Interval:  348 QTC Calculation: 421 R Axis:   118 Text Interpretation:  Sinus rhythm Biatrial  enlargement Right ventricular hypertrophy Baseline wander in lead(s) V2 No significant change since last tracing Confirmed by Shaune Pollack [phone]) on 08/22/2017 6:58:12 PM       Radiology Dg Chest 2 View  Result Date: 08/22/2017 CLINICAL DATA:  One day of cough, shortness of breath, and chest tightness. Former smoker. No history of asthma or known diagnosis of COPD. EXAM: CHEST  2 VIEW COMPARISON:  PA and lateral chest x-ray of April 13, 2017 FINDINGS: The lungs are mildly hyperinflated and clear. The heart and pulmonary vascularity are normal. The mediastinum is normal in width. There is no pleural effusion. The bony thorax is unremarkable. IMPRESSION: Hyperinflation consistent with reactive airway disease or the patient's smoking history. No pneumonia, CHF, nor other acute cardiopulmonary abnormality. Electronically Signed   By: David  [place] M.D.   On: 08/22/2017 14:41    Procedures Procedures (including critical care time)  Medications Ordered in ED Medications  albuterol (PROVENTIL HFA;VENTOLIN HFA) 108 (90 Base) MCG/ACT inhaler 1 puff (not administered)  predniSONE (DELTASONE) tablet 40 mg (not administered)  albuterol (PROVENTIL) (2.5 MG/3ML) 0.083% nebulizer solution 5 mg (5 mg  Nebulization Given 08/22/17 1340)  ipratropium-albuterol (DUONEB) 0.5-2.5 (3) MG/3ML nebulizer solution 3 mL (3 mLs Nebulization Given 08/22/17 1856)  acetaminophen (TYLENOL) tablet 650 mg (650 mg Oral Given 08/22/17 1856)     Initial Impression / Assessment and Plan / ED Course  I have reviewed the triage vital signs and the nursing notes.  Pertinent labs & imaging results that were available during my care of the patient were reviewed by me and considered in my medical decision making (see chart for details).     Patient, with a past medical history of asthma who presents to ED with intermittent chest pain, shortness of breath, dry cough for the past week.  States that this does feel similar to his  prior asthma exacerbations.  He ran out of his albuterol inhaler several weeks ago.   Patient reports improvement in chest tightness with albuterol nebulizer treatment given prior to my evaluation.  He still states that he is having wheezing.  Tachycardic to low 100s during my exam.  Oxygen saturations 94-95% on room air. Will obtain labs, troponin and D-dimer due to symptoms and vitals.  Patient reports much improvement in his symptoms with DuoNeb given.  CBC, BMP, troponin unremarkable.  Chest x-ray shows signs of reactive airway disease.  D-dimer unremarkable.  EKG with tracing similar to previous.  Tachycardia and oxygen saturations improved with DuoNeb.  Patient continues to be hypertensive.  This could be partly due to his discomfort.  He also states that he has been more stressed, anxious due to beginning his new job.  Patient states that he does not feel like he needs to be admitted for his blood pressure.  States that he used to be on blood pressure medications but has not taken anything in the past year. Chart review reveals he was on HCTZ.  Will give first dose of prednisone and inhaler here.  He declines Decadron.  Will send home with steroid burst, hydrochlorothiazide, Tessalon Perles, refill for albuterol inhaler and nebulizer.  Advised to follow-up at [facility] for further evaluation.  Final Clinical Impressions(s) / ED Diagnoses   Final diagnoses:  Moderate asthma with exacerbation, unspecified whether persistent    New Prescriptions New Prescriptions   ALBUTEROL (PROVENTIL HFA;VENTOLIN HFA) 108 (90 BASE) MCG/ACT INHALER    Inhale 1-2 puffs into the lungs every 6 (six) hours as needed for wheezing or shortness of breath.   ALBUTEROL (PROVENTIL) (2.5 MG/3ML) 0.083% NEBULIZER SOLUTION    Take 3 mLs (2.5 mg total) by nebulization every 6 (six) hours as needed for wheezing or shortness of breath.   BENZONATATE (TESSALON) 100 MG CAPSULE    Take 2 capsules (200 mg total) by  mouth every 8 (eight) hours.   PREDNISONE (DELTASONE) 10 MG TABLET    Take 4 tablets (40 mg total) by mouth daily.     Dietrich Pates, PA-C 08/22/17 2049    Shaune Pollack, MD 08/23/17 1147

## 2017-08-22 NOTE — ED Triage Notes (Signed)
Pt complains of shortness of breath, cough, chest tightness for the past week. Pt has hx of asthma.

## 2017-08-22 NOTE — Discharge Instructions (Signed)
Please read the attached information regarding your condition. Take prednisone once daily starting tomorrow for 4 days. Take hydrochlorothiazide for blood pressure daily. Take Tessalon Perles as needed for cough. Use albuterol inhaler and nebulizer treatments as needed. Follow[facility]up at [facility] for further evaluation. Return to ED for worsening chest pain, increased shortness of breath, coughing up blood, fever, lightheadedness or loss of consciousness.

## 2017-09-06 ENCOUNTER — Emergency Department (HOSPITAL_COMMUNITY): Payer: Medicaid Other

## 2017-09-06 ENCOUNTER — Other Ambulatory Visit: Payer: Self-pay

## 2017-09-06 ENCOUNTER — Encounter (HOSPITAL_COMMUNITY): Payer: Self-pay

## 2017-09-06 ENCOUNTER — Inpatient Hospital Stay (HOSPITAL_COMMUNITY)
Admission: EM | Admit: 2017-09-06 | Discharge: 2017-09-08 | DRG: 202 | Disposition: A | Discharge status: Home / Self Care | Payer: Medicaid Other | Attending: Internal Medicine | Admitting: Internal Medicine

## 2017-09-06 DIAGNOSIS — Z23 Encounter for immunization: Secondary | ICD-10-CM

## 2017-09-06 DIAGNOSIS — Z87891 Personal history of nicotine dependence: Secondary | ICD-10-CM

## 2017-09-06 DIAGNOSIS — Z79899 Other long term (current) drug therapy: Secondary | ICD-10-CM

## 2017-09-06 DIAGNOSIS — I10 Essential (primary) hypertension: Secondary | ICD-10-CM | POA: Diagnosis present

## 2017-09-06 DIAGNOSIS — X58XXXA Exposure to other specified factors, initial encounter: Secondary | ICD-10-CM | POA: Diagnosis present

## 2017-09-06 DIAGNOSIS — J45902 Unspecified asthma with status asthmaticus: Secondary | ICD-10-CM

## 2017-09-06 DIAGNOSIS — J4532 Mild persistent asthma with status asthmaticus: Secondary | ICD-10-CM | POA: Diagnosis present

## 2017-09-06 DIAGNOSIS — B9789 Other viral agents as the cause of diseases classified elsewhere: Secondary | ICD-10-CM | POA: Diagnosis present

## 2017-09-06 DIAGNOSIS — E876 Hypokalemia: Secondary | ICD-10-CM | POA: Diagnosis not present

## 2017-09-06 DIAGNOSIS — Z888 Allergy status to other drugs, medicaments and biological substances status: Secondary | ICD-10-CM

## 2017-09-06 DIAGNOSIS — J9601 Acute respiratory failure with hypoxia: Secondary | ICD-10-CM | POA: Diagnosis not present

## 2017-09-06 DIAGNOSIS — T502X6A Underdosing of carbonic-anhydrase inhibitors, benzothiadiazides and other diuretics, initial encounter: Secondary | ICD-10-CM | POA: Diagnosis present

## 2017-09-06 DIAGNOSIS — Z91128 Patient's intentional underdosing of medication regimen for other reason: Secondary | ICD-10-CM

## 2017-09-06 DIAGNOSIS — Z7951 Long term (current) use of inhaled steroids: Secondary | ICD-10-CM

## 2017-09-06 HISTORY — DX: Essential (primary) hypertension: I10

## 2017-09-06 HISTORY — DX: Unspecified asthma, uncomplicated: J45.909

## 2017-09-06 LAB — I-STAT CHEM 8, ED
BUN: 14 mg/dL (ref 6–20)
CHLORIDE: 105 mmol/L (ref 101–111)
Calcium, Ion: 1.05 mmol/L — ABNORMAL LOW (ref 1.15–1.40)
Creatinine, Ser: 1 mg/dL (ref 0.61–1.24)
Glucose, Bld: 140 mg/dL — ABNORMAL HIGH (ref 65–99)
HEMATOCRIT: 48 % (ref 39.0–52.0)
Hemoglobin: 16.3 g/dL (ref 13.0–17.0)
POTASSIUM: 3.2 mmol/L — AB (ref 3.5–5.1)
SODIUM: 139 mmol/L (ref 135–145)
TCO2: 22 mmol/L (ref 22–32)

## 2017-09-06 LAB — CBC WITH DIFFERENTIAL/PLATELET
BASOS ABS: 0 10*3/uL (ref 0.0–0.1)
BASOS PCT: 0 %
EOS ABS: 0.2 10*3/uL (ref 0.0–0.7)
EOS PCT: 1 %
HCT: 44.6 % (ref 39.0–52.0)
HEMOGLOBIN: 14.9 g/dL (ref 13.0–17.0)
Lymphocytes Relative: 5 %
Lymphs Abs: 0.5 10*3/uL — ABNORMAL LOW (ref 0.7–4.0)
MCH: 29.6 pg (ref 26.0–34.0)
MCHC: 33.4 g/dL (ref 30.0–36.0)
MCV: 88.7 fL (ref 78.0–100.0)
MONOS PCT: 4 %
Monocytes Absolute: 0.5 10*3/uL (ref 0.1–1.0)
Neutro Abs: 10.3 10*3/uL — ABNORMAL HIGH (ref 1.7–7.7)
Neutrophils Relative %: 90 %
PLATELETS: 251 10*3/uL (ref 150–400)
RBC: 5.03 MIL/uL (ref 4.22–5.81)
RDW: 12.9 % (ref 11.5–15.5)
WBC: 11.4 10*3/uL — ABNORMAL HIGH (ref 4.0–10.5)

## 2017-09-06 MED ORDER — ENOXAPARIN SODIUM 40 MG/0.4ML ~~LOC~~ SOLN
40.0000 mg | Freq: Every day | SUBCUTANEOUS | Status: DC
  Filled 2017-09-06 (×2): qty 0.4

## 2017-09-06 MED ORDER — BUDESONIDE 0.25 MG/2ML IN SUSP
0.2500 mg | Freq: Two times a day (BID) | RESPIRATORY_TRACT | Status: DC
  Administered 2017-09-06 – 2017-09-08 (×4): 0.25 mg via RESPIRATORY_TRACT
  Filled 2017-09-06 (×5): qty 2

## 2017-09-06 MED ORDER — ACETAMINOPHEN 650 MG RE SUPP: 650.0000 mg | Freq: Four times a day (QID) | RECTAL | Status: DC | PRN

## 2017-09-06 MED ORDER — ALBUTEROL (5 MG/ML) CONTINUOUS INHALATION SOLN
10.0000 mg/h | INHALATION_SOLUTION | Freq: Once | RESPIRATORY_TRACT | Status: AC
  Administered 2017-09-06: 10 mg/h via RESPIRATORY_TRACT
  Filled 2017-09-06: qty 0.5

## 2017-09-06 MED ORDER — ALBUTEROL (5 MG/ML) CONTINUOUS INHALATION SOLN
10.0000 mg/h | INHALATION_SOLUTION | Freq: Once | RESPIRATORY_TRACT | Status: AC
  Administered 2017-09-06: 10 mg/h via RESPIRATORY_TRACT

## 2017-09-06 MED ORDER — BENZONATATE 100 MG PO CAPS
200.0000 mg | ORAL_CAPSULE | Freq: Three times a day (TID) | ORAL | Status: DC
  Administered 2017-09-07 – 2017-09-08 (×4): 200 mg via ORAL
  Filled 2017-09-06 (×4): qty 2

## 2017-09-06 MED ORDER — BENZONATATE 100 MG PO CAPS
100.0000 mg | ORAL_CAPSULE | Freq: Once | ORAL | Status: AC
  Administered 2017-09-06: 100 mg via ORAL
  Filled 2017-09-06: qty 1

## 2017-09-06 MED ORDER — ALBUTEROL SULFATE (2.5 MG/3ML) 0.083% IN NEBU
5.0000 mg | INHALATION_SOLUTION | Freq: Once | RESPIRATORY_TRACT | Status: AC
  Administered 2017-09-06: 5 mg via RESPIRATORY_TRACT
  Filled 2017-09-06: qty 6

## 2017-09-06 MED ORDER — BENZONATATE 100 MG PO CAPS: 200.0000 mg | ORAL_CAPSULE | Freq: Three times a day (TID) | ORAL | Status: DC

## 2017-09-06 MED ORDER — ALBUTEROL SULFATE HFA 108 (90 BASE) MCG/ACT IN AERS: 2.0000 | INHALATION_SPRAY | Freq: Four times a day (QID) | RESPIRATORY_TRACT | Status: DC | PRN

## 2017-09-06 MED ORDER — PREDNISONE 50 MG PO TABS
50.0000 mg | ORAL_TABLET | Freq: Every day | ORAL | Status: DC
  Administered 2017-09-07 – 2017-09-08 (×2): 50 mg via ORAL
  Filled 2017-09-06 (×2): qty 1

## 2017-09-06 MED ORDER — ALBUTEROL (5 MG/ML) CONTINUOUS INHALATION SOLN
10.0000 mg/h | INHALATION_SOLUTION | Freq: Once | RESPIRATORY_TRACT | Status: AC
  Administered 2017-09-06: 10 mg/h via RESPIRATORY_TRACT
  Filled 2017-09-06: qty 2

## 2017-09-06 MED ORDER — PREDNISONE 20 MG PO TABS
60.0000 mg | ORAL_TABLET | Freq: Once | ORAL | Status: AC
  Administered 2017-09-06: 60 mg via ORAL
  Filled 2017-09-06: qty 3

## 2017-09-06 MED ORDER — ACETAMINOPHEN 325 MG PO TABS
650.0000 mg | ORAL_TABLET | Freq: Four times a day (QID) | ORAL | Status: DC | PRN
  Administered 2017-09-07: 650 mg via ORAL
  Filled 2017-09-06: qty 2

## 2017-09-06 MED ORDER — IPRATROPIUM BROMIDE 0.02 % IN SOLN
0.5000 mg | Freq: Once | RESPIRATORY_TRACT | Status: AC
  Administered 2017-09-06: 0.5 mg via RESPIRATORY_TRACT
  Filled 2017-09-06: qty 2.5

## 2017-09-06 MED ORDER — ALBUTEROL SULFATE (2.5 MG/3ML) 0.083% IN NEBU
2.5000 mg | INHALATION_SOLUTION | RESPIRATORY_TRACT | Status: DC | PRN
  Administered 2017-09-06 – 2017-09-07 (×2): 2.5 mg via RESPIRATORY_TRACT
  Filled 2017-09-06 (×2): qty 3

## 2017-09-06 MED ORDER — ONDANSETRON HCL 4 MG/2ML IJ SOLN: 4.0000 mg | Freq: Four times a day (QID) | INTRAMUSCULAR | Status: DC | PRN

## 2017-09-06 MED ORDER — ONDANSETRON HCL 4 MG PO TABS: 4.0000 mg | ORAL_TABLET | Freq: Four times a day (QID) | ORAL | Status: DC | PRN

## 2017-09-06 MED ORDER — SODIUM CHLORIDE 0.9 % IV BOLUS (SEPSIS)
1000.0000 mL | Freq: Once | INTRAVENOUS | Status: AC
  Administered 2017-09-06: 1000 mL via INTRAVENOUS

## 2017-09-06 MED ORDER — ALBUTEROL (5 MG/ML) CONTINUOUS INHALATION SOLN
INHALATION_SOLUTION | RESPIRATORY_TRACT | Status: AC
  Filled 2017-09-06: qty 2

## 2017-09-06 MED ORDER — AMLODIPINE BESYLATE 5 MG PO TABS: 2.5000 mg | ORAL_TABLET | Freq: Every day | ORAL | Status: DC

## 2017-09-06 MED ORDER — MAGNESIUM SULFATE 2 GM/50ML IV SOLN
2.0000 g | Freq: Once | INTRAVENOUS | Status: AC
  Administered 2017-09-06: 2 g via INTRAVENOUS
  Filled 2017-09-06: qty 50

## 2017-09-06 MED ORDER — ALBUTEROL SULFATE (2.5 MG/3ML) 0.083% IN NEBU: 2.5000 mg | INHALATION_SOLUTION | Freq: Four times a day (QID) | RESPIRATORY_TRACT | Status: DC | PRN

## 2017-09-06 NOTE — Progress Notes (Signed)
Pt has arrived to 5w03, wife at the bedside. Pt identified appropriately, alert and oriented x 4, VS stable, no signs of acute distress. Wheezing auscultated on assessment, PRN breathing treatment administered, pt verbalized improvement. Continuous pulse ox in place. Pt independent, instructed to call for assistance, oriented to room and equipment. Will continue to monitor and treat pt per MD orders.

## 2017-09-06 NOTE — ED Provider Notes (Signed)
[facility] EMERGENCY DEPARTMENT Provider Note   CSN: [id number] Arrival date & time: 09/06/17  1533     History   Chief Complaint Chief Complaint  Patient presents with  . Asthma    HPI William [name] is a 36 y.o. male.  HPI   36 year old male with history of asthma and hypertension presenting for evaluation of shortness of breath.  Patient report for the past 2 days he has had generalized body aches, nonproductive cough, wheezes, shortness of breath.  Symptoms felt similar to prior asthma exacerbation.  He does have a rescue inhaler which he has been using every 15 minutes throughout the day today without adequate relief.  He is concerned for potential pneumonia.  He denies any recent sick contact.  He denies abusing tobacco.  No recent travel, no prior history of PE or DVT, no focal localized leg swelling or calf pain.  Denies having fever chills, runny nose, sneezing, sore throat or ear pain or headache.  No pleuritic chest pain.  Past Medical History:  Diagnosis Date  . Asthma   . Hypertension     There are no active problems to display for this patient.   Past Surgical History:  Procedure Laterality Date  . skull surgery         Home Medications    Prior to Admission medications   Medication Sig Start Date End Date Taking? Authorizing Provider  albuterol (PROVENTIL HFA;VENTOLIN HFA) 108 (90 Base) MCG/ACT inhaler Inhale 1-2 puffs into the lungs every 6 (six) hours as needed for wheezing or shortness of breath. 08/22/17   Khatri, Hina, PA-C  albuterol (PROVENTIL) (2.5 MG/3ML) 0.083% nebulizer solution Take 3 mLs (2.5 mg total) by nebulization every 6 (six) hours as needed for wheezing or shortness of breath. 08/22/17   Khatri, Hina, PA-C  benzonatate (TESSALON) 100 MG capsule Take 2 capsules (200 mg total) by mouth every 8 (eight) hours. 08/22/17   Khatri, Hina, PA-C  hydrochlorothiazide (MICROZIDE) 12.5 MG capsule Take 1 capsule (12.5 mg total) by  mouth daily. 08/22/17   Khatri, Hina, PA-C  Phenyleph-CPM-DM-APAP (ALKA-SELTZER PLUS COLD & COUGH) 02-21-09-325 MG CAPS Take 2 capsules by mouth daily as needed (cold).    [provider]    Family History History reviewed. No pertinent family history.  Social History Social History   Tobacco Use  . Smoking status: Former Games developer  . Smokeless tobacco: Never Used  Substance Use Topics  . Alcohol use: Yes  . Drug use: Yes    Types: Marijuana     Allergies   Patient has no known allergies.   Review of Systems Review of Systems  All other systems reviewed and are negative.    Physical Exam Updated Vital Signs BP (!) 162/110 (BP Location: Left Arm)   Pulse (!) 108   Temp 98 F (36.7 C) (Oral)   Resp 18   SpO2 90%   Physical Exam  Constitutional: He appears well-developed and well-nourished. No distress.  HENT:  Head: Atraumatic.  Ears: Cerumen impaction to both ears obstructing TM Nose: Normal nares Throat: Uvula is midline no tonsillar enlargement or exudates, no trismus  Eyes: Conjunctivae are normal.  Neck: Normal range of motion. Neck supple. No tracheal deviation present.  Cardiovascular: Intact distal pulses.  Mild tachycardia without murmur rubs or gallops  Pulmonary/Chest: He has wheezes.  Tachypneic, with inspiratory and expiratory wheezes without rales or rhonchi.  Accessory muscle use  Abdominal: Soft. He exhibits no distension. There is no  tenderness.  Musculoskeletal: He exhibits no edema.  Lymphadenopathy:    He has no cervical adenopathy.  Neurological: He is alert.  Skin: No rash noted.  Psychiatric: He has a normal mood and affect.  Nursing note and vitals reviewed.    ED Treatments / Results  Labs (all labs ordered are listed, but only abnormal results are displayed) Labs Reviewed  RESPIRATORY PANEL BY PCR  CBC WITH DIFFERENTIAL/PLATELET  I-STAT CHEM 8, ED    EKG  EKG Interpretation None       Radiology Dg Chest 2  View  Result Date: 09/06/2017 CLINICAL DATA:  Chest pain and shortness of Breath EXAM: CHEST  2 VIEW COMPARISON:  08/22/2017 FINDINGS: Melene MullerShadow is within normal limits. The lungs are again hyperinflated but stable. No focal infiltrate or sizable effusion is seen. No acute bony abnormality is noted. IMPRESSION: Persistent hyperinflation consistent with the given clinical history. No acute abnormality is noted. Electronically Signed   By: Alcide CleverMark  Lukens M.D.   On: 09/06/2017 16:21    Procedures Procedures (including critical care time)  Medications Ordered in ED Medications  albuterol (PROVENTIL, VENTOLIN) (5 MG/ML) 0.5% continuous inhalation solution (not administered)  albuterol (PROVENTIL) (2.5 MG/3ML) 0.083% nebulizer solution 5 mg (5 mg Nebulization Given 09/06/17 1547)  predniSONE (DELTASONE) tablet 60 mg (60 mg Oral Given 09/06/17 1649)  benzonatate (TESSALON) capsule 100 mg (100 mg Oral Given 09/06/17 1649)  albuterol (PROVENTIL,VENTOLIN) solution continuous neb (10 mg/hr Nebulization Given 09/06/17 1643)  ipratropium (ATROVENT) nebulizer solution 0.5 mg (0.5 mg Nebulization Given 09/06/17 1643)  albuterol (PROVENTIL,VENTOLIN) solution continuous neb (10 mg/hr Nebulization Given 09/06/17 1748)  magnesium sulfate IVPB 2 g 50 mL (2 g Intravenous New Bag/Given 09/06/17 1802)  sodium chloride 0.9 % bolus 1,000 mL (1,000 mLs Intravenous New Bag/Given 09/06/17 1802)     Initial Impression / Assessment and Plan / ED Course  I have reviewed the triage vital signs and the nursing notes.  Pertinent labs & imaging results that were available during my care of the patient were reviewed by me and considered in my medical decision making (see chart for details).     BP (!) 148/94   Pulse (!) 103   Temp 98 F (36.7 C) (Oral)   Resp 15   SpO2 94%    Final Clinical Impressions(s) / ED Diagnoses   Final diagnoses:  Asthma with status asthmaticus, unspecified asthma severity, unspecified  whether persistent    ED Discharge Orders    None     4:43 PM Patient here for complaints of increased shortness of breath and wheezing consistent with an asthma exacerbation.  Chest x-ray today without focal infiltrate concerning for pneumonia.  He is afebrile.  He will benefit from prednisone, continuous nebulizer, cough medication.  5:46 PM Despite receiving continuous albuterol nebs at 10mg /hr, and prednisone 60mg , no improvement of lung function.  Will continue with breathing treatment, give magnesium sulfate, IVF and continue to monitor.    7:45 PM Pt has received two continuous CAT without much improvement.  Has accessory muscle use.  Sats at 92 on RA while at rest.  He also received mag sulfate without relief.  Will initiate BiPAP, obtain labs.  Pt sign out to oncoming provider who will monitor and admit pt for further management of his asthma exacerbation.    CRITICAL CARE Performed by: Fayrene HelperBowie Bettey Muraoka Total critical care time: 35 minutes Critical care time was exclusive of separately billable procedures and treating other patients. Critical care was necessary to treat  or prevent imminent or life-threatening deterioration. Critical care was time spent personally by me on the following activities: development of treatment plan with patient and/or surrogate as well as nursing, discussions with consultants, evaluation of patient's response to treatment, examination of patient, obtaining history from patient or surrogate, ordering and performing treatments and interventions, ordering and review of laboratory studies, ordering and review of radiographic studies, pulse oximetry and re-evaluation of patient's condition.     Fayrene Helperran, Aaliya Maultsby, PA-C 09/06/17 Malvin Johns2003    Schlossman, Erin, MD 09/09/17 1757

## 2017-09-06 NOTE — H&P (Addendum)
History and Physical    William [name] VOZ:[id number] DOB: Nov 27, 1980 DOA: 09/06/2017  PCP: Patient, No Pcp Per  Patient coming from: Home  I have personally briefly reviewed patient[facility]  Chief Complaint: Asthma exacerbation  HPI: William [name] is a 36 y.o. male with medical history significant of HTN, asthma.  Presents to the ED with c/o asthma exacerbation.  Recently seen on 10/31.  Discharged on 4 days steroids and rescue inhaler.  Finished steroids, but asthma symptoms have reoccurred now.  For past 2 days has had non-productive cough, wheezing, SOB.  Denies fever, chills, body aches.  Using rescue inhaler every 15 mins hasnt helped.   ED Course: Albuterol, mag, prednisone, initially put on BIPAP, now breathing much better after hour long nebs.   Review of Systems: As per HPI otherwise 10 point review of systems negative.   Past Medical History:  Diagnosis Date  . Asthma   . Hypertension     Past Surgical History:  Procedure Laterality Date  . skull surgery       reports that he has quit smoking. he has never used smokeless tobacco. He reports that he drinks alcohol. He reports that he uses drugs. Drug: Marijuana.  Allergies  Allergen Reactions  . Hydrochlorothiazide Other (See Comments)    Causes headaches    History reviewed. No pertinent family history.   Prior to Admission medications   Medication Sig Start Date End Date Taking? Authorizing Provider  albuterol (PROVENTIL HFA;VENTOLIN HFA) 108 (90 Base) MCG/ACT inhaler Inhale 1-2 puffs into the lungs every 6 (six) hours as needed for wheezing or shortness of breath. Patient taking differently: Inhale 2 puffs every 6 (six) hours as needed into the lungs for wheezing or shortness of breath.  08/22/17  Yes Khatri, Hina, PA-C  guaifenesin (TUSSIN CHEST CONGESTION) 100 MG/5ML syrup Take 200 mg 3 (three) times daily as needed by mouth for cough or congestion.   Yes [provider]  albuterol (PROVENTIL) (2.5 MG/3ML) 0.083% nebulizer solution Take 3 mLs (2.5 mg total) by nebulization every 6 (six) hours as needed for wheezing or shortness of breath. Patient not taking: Reported on 09/06/2017 08/22/17   Dietrich PatesKhatri, Hina, PA-C  benzonatate (TESSALON) 100 MG capsule Take 2 capsules (200 mg total) by mouth every 8 (eight) hours. Patient not taking: Reported on 09/06/2017 08/22/17   Dietrich PatesKhatri, Hina, PA-C    Physical Exam: Vitals:   09/06/17 1900 09/06/17 2000 09/06/17 2022 09/06/17 2030  BP: (!) 148/94 (!) 155/94  (!) 141/99  Pulse: (!) 103 100 87 89  Resp: 15 16 16 15   Temp:      TempSrc:      SpO2:  93% 97% 94%    Constitutional: NAD, calm, comfortable Eyes: PERRL, lids and conjunctivae normal ENMT: Mucous membranes are moist. Posterior pharynx clear of any exudate or lesions.Normal dentition.  Neck: normal, supple, no masses, no thyromegaly Respiratory: clear to auscultation bilaterally, no wheezing, no crackles. Normal respiratory effort. No accessory muscle use.  Cardiovascular: Regular rate and rhythm, no murmurs / rubs / gallops. No extremity edema. 2+ pedal pulses. No carotid bruits.  Abdomen: no tenderness, no masses palpated. No hepatosplenomegaly. Bowel sounds positive.  Musculoskeletal: no clubbing / cyanosis. No joint deformity upper and lower extremities. Good ROM, no contractures. Normal muscle tone.  Skin: no rashes, lesions, ulcers. No induration Neurologic: CN 2-12 grossly intact. Sensation intact, DTR normal. Strength 5/5 in all 4.  Psychiatric: Normal judgment and  insight. Alert and oriented x 3. Normal mood.    Labs on Admission: I have personally reviewed following labs and imaging studies  CBC: Recent Labs  Lab 09/06/17 1951 09/06/17 2002  WBC 11.4*  --   NEUTROABS 10.3*  --   HGB 14.9 16.3  HCT 44.6 48.0  MCV 88.7  --   PLT 251  --    Basic Metabolic Panel: Recent Labs  Lab 09/06/17 2002  NA 139  K 3.2*  CL 105    GLUCOSE 140*  BUN 14  CREATININE 1.00   GFR: CrCl cannot be calculated (Unknown ideal weight.). Liver Function Tests: No results for input(s): AST, ALT, ALKPHOS, BILITOT, PROT, ALBUMIN in the last 168 hours. No results for input(s): LIPASE, AMYLASE in the last 168 hours. No results for input(s): AMMONIA in the last 168 hours. Coagulation Profile: No results for input(s): INR, PROTIME in the last 168 hours. Cardiac Enzymes: No results for input(s): CKTOTAL, CKMB, CKMBINDEX, TROPONINI in the last 168 hours. BNP (last 3 results) No results for input(s): PROBNP in the last 8760 hours. HbA1C: No results for input(s): HGBA1C in the last 72 hours. CBG: No results for input(s): GLUCAP in the last 168 hours. Lipid Profile: No results for input(s): CHOL, HDL, LDLCALC, TRIG, CHOLHDL, LDLDIRECT in the last 72 hours. Thyroid Function Tests: No results for input(s): TSH, T4TOTAL, FREET4, T3FREE, THYROIDAB in the last 72 hours. Anemia Panel: No results for input(s): VITAMINB12, FOLATE, FERRITIN, TIBC, IRON, RETICCTPCT in the last 72 hours. Urine analysis:    Component Value Date/Time   COLORURINE YELLOW 04/06/2007 0814   APPEARANCEUR CLEAR 04/06/2007 0814   LABSPEC 1.021 04/06/2007 0814   PHURINE 5.5 04/06/2007 0814   GLUCOSEU NEGATIVE 04/06/2007 0814   HGBUR NEGATIVE 04/06/2007 0814   BILIRUBINUR NEGATIVE 04/06/2007 0814   KETONESUR NEGATIVE 04/06/2007 0814   PROTEINUR NEGATIVE 04/06/2007 0814   UROBILINOGEN 0.2 04/06/2007 0814   NITRITE NEGATIVE 04/06/2007 0814   LEUKOCYTESUR  04/06/2007 0814    NEGATIVE MICROSCOPIC NOT DONE ON URINES WITH NEGATIVE PROTEIN, BLOOD, LEUKOCYTES, NITRITE, OR GLUCOSE <1000 mg/dL.    Radiological Exams on Admission: Dg Chest 2 View  Result Date: 09/06/2017 CLINICAL DATA:  Chest pain and shortness of Breath EXAM: CHEST  2 VIEW COMPARISON:  08/22/2017 FINDINGS: Melene Muller is within normal limits. The lungs are again hyperinflated but stable. No focal  infiltrate or sizable effusion is seen. No acute bony abnormality is noted. IMPRESSION: Persistent hyperinflation consistent with the given clinical history. No acute abnormality is noted. Electronically Signed   By: Alcide Clever M.D.   On: 09/06/2017 16:21    EKG: Independently reviewed.  Assessment/Plan Principal Problem:   Asthma in adult, mild persistent, with status asthmaticus Active Problems:   HTN (hypertension)    1. Asthma - 1. Bipap off for now 2. Adult wheeze protocol for albuterol treatments 3. Prednisone daily 4. Starting Pulmicort as preventative 5. Went through possible triggers:  1. He is a non smoker and no smokers in house 2. There are no pets in house 3. Does not appear to be seasonal 4. Noted: there are 2 children in house with asthma 6. Discussed overuse of rescue albuterol as indication to get in to ED STAT! 7. Continuous pulse ox 2. HTN - 1. Likely underlying hypertension 2. But he stopped taking HCTZ due to headaches 3. Needs outpatient PCP follow up, dont want to start him on new HTN med right now in acute setting for asymptomatic SBP < 180 with  steroids and beta agonists on board.  DVT prophylaxis: Lovenox Code Status: Full Family Communication: Wife at bedside Disposition Plan: Home after admit Consults called: None Admission status: Place in [place]   Haile Toppins, Heywood IlesJARED M. DO [facility] Pager [phone]  If 7AM-7PM, please contact day team taking care of patient [email or web address] Password [email or web address]  09/06/2017, 9:36 PM

## 2017-09-06 NOTE — ED Triage Notes (Signed)
Pt endorses shob and asthma exacerbation that has been going on since yesterday. Pt seen recently for same and placed on a prednisone and was doing better until yesterday. Bilateral wheezing noted. Spo2 90% on RA in triage.

## 2017-09-06 NOTE — ED Provider Notes (Signed)
Assumed care of patient from PA Tran at shift change.  See his note for full H&P.  Briefly, 36 y.o. [name] here with SOB.  Appears to be asthma exacerbation.  Has had several nebs without improvement.  Also given 2g mag, steroids.  Plan:  Labs pending.  Will need admission.  Started on second CAT through bipap.  Results for orders placed or performed during the hospital encounter of 09/06/17  CBC with Differential/Platelet  Result Value Ref Range   WBC 11.4 (H) 4.0 - 10.5 K/uL   RBC 5.03 4.22 - 5.81 MIL/uL   Hemoglobin 14.9 13.0 - 17.0 g/dL   HCT 16.144.6 09.639.0 - 04.552.0 %   MCV 88.7 78.0 - 100.0 fL   MCH 29.6 26.0 - 34.0 pg   MCHC 33.4 30.0 - 36.0 g/dL   RDW 40.912.9 81.111.5 - 91.415.5 %   Platelets 251 150 - 400 K/uL   Neutrophils Relative % 90 %   Neutro Abs 10.3 (H) 1.7 - 7.7 K/uL   Lymphocytes Relative 5 %   Lymphs Abs 0.5 (L) 0.7 - 4.0 K/uL   Monocytes Relative 4 %   Monocytes Absolute 0.5 0.1 - 1.0 K/uL   Eosinophils Relative 1 %   Eosinophils Absolute 0.2 0.0 - 0.7 K/uL   Basophils Relative 0 %   Basophils Absolute 0.0 0.0 - 0.1 K/uL  I-stat chem 8, ed  Result Value Ref Range   Sodium 139 135 - 145 mmol/L   Potassium 3.2 (L) 3.5 - 5.1 mmol/L   Chloride 105 101 - 111 mmol/L   BUN 14 6 - 20 mg/dL   Creatinine, Ser 7.821.00 0.61 - 1.24 mg/dL   Glucose, Bld 956140 (H) 65 - 99 mg/dL   Calcium, Ion 2.131.05 (L) 1.15 - 1.40 mmol/L   TCO2 22 22 - 32 mmol/L   Hemoglobin 16.3 13.0 - 17.0 g/dL   HCT 08.648.0 57.839.0 - 46.952.0 %   Dg Chest 2 View  Result Date: 09/06/2017 CLINICAL DATA:  Chest pain and shortness of Breath EXAM: CHEST  2 VIEW COMPARISON:  08/22/2017 FINDINGS: Melene MullerShadow is within normal limits. The lungs are again hyperinflated but stable. No focal infiltrate or sizable effusion is seen. No acute bony abnormality is noted. IMPRESSION: Persistent hyperinflation consistent with the given clinical history. No acute abnormality is noted. Electronically Signed   By: Alcide CleverMark  Lukens [name]   On: 09/06/2017 16:21   Dg  Chest 2 View  Result Date: 08/22/2017 CLINICAL DATA:  One day of cough, shortness of breath, and chest tightness. Former smoker. No history of asthma or known diagnosis of COPD. EXAM: CHEST  2 VIEW COMPARISON:  PA and lateral chest x-ray of April 13, 2017 FINDINGS: The lungs are mildly hyperinflated and clear. The heart and pulmonary vascularity are normal. The mediastinum is normal in width. There is no pleural effusion. The bony thorax is unremarkable. IMPRESSION: Hyperinflation consistent with reactive airway disease or the patient's smoking history. No pneumonia, CHF, nor other acute cardiopulmonary abnormality. Electronically Signed   By: David  [place] [name]   On: 08/22/2017 14:41    8:26 PM Patient labs overall reassuring.  Mildly low K+, may be albuterol induced.  Respiratory panel pending.  Plan will be admission for continued asthma treatment.  Given he is on bipap, will need step-down.  Discussed with Dr. Beacher MayGardener, he will admit for ongoing care.   [name], [name], PA-C 09/06/17 2126    Alvira MondaySchlossman, Erin, MD 09/09/17 [phone]

## 2017-09-07 ENCOUNTER — Other Ambulatory Visit: Payer: Self-pay

## 2017-09-07 DIAGNOSIS — Z87891 Personal history of nicotine dependence: Secondary | ICD-10-CM | POA: Diagnosis not present

## 2017-09-07 DIAGNOSIS — B9789 Other viral agents as the cause of diseases classified elsewhere: Secondary | ICD-10-CM | POA: Diagnosis present

## 2017-09-07 DIAGNOSIS — T502X6A Underdosing of carbonic-anhydrase inhibitors, benzothiadiazides and other diuretics, initial encounter: Secondary | ICD-10-CM | POA: Diagnosis present

## 2017-09-07 DIAGNOSIS — E876 Hypokalemia: Secondary | ICD-10-CM | POA: Diagnosis not present

## 2017-09-07 DIAGNOSIS — Z91128 Patient's intentional underdosing of medication regimen for other reason: Secondary | ICD-10-CM | POA: Diagnosis not present

## 2017-09-07 DIAGNOSIS — Z79899 Other long term (current) drug therapy: Secondary | ICD-10-CM | POA: Diagnosis not present

## 2017-09-07 DIAGNOSIS — Z888 Allergy status to other drugs, medicaments and biological substances status: Secondary | ICD-10-CM | POA: Diagnosis not present

## 2017-09-07 DIAGNOSIS — J4532 Mild persistent asthma with status asthmaticus: Secondary | ICD-10-CM | POA: Diagnosis present

## 2017-09-07 DIAGNOSIS — X58XXXA Exposure to other specified factors, initial encounter: Secondary | ICD-10-CM | POA: Diagnosis present

## 2017-09-07 DIAGNOSIS — I10 Essential (primary) hypertension: Secondary | ICD-10-CM | POA: Diagnosis present

## 2017-09-07 DIAGNOSIS — J9601 Acute respiratory failure with hypoxia: Secondary | ICD-10-CM | POA: Diagnosis not present

## 2017-09-07 DIAGNOSIS — Z23 Encounter for immunization: Secondary | ICD-10-CM | POA: Diagnosis not present

## 2017-09-07 DIAGNOSIS — Z7951 Long term (current) use of inhaled steroids: Secondary | ICD-10-CM | POA: Diagnosis not present

## 2017-09-07 LAB — RESPIRATORY PANEL BY PCR
ADENOVIRUS-RVPPCR: NOT DETECTED
BORDETELLA PERTUSSIS-RVPCR: NOT DETECTED
CHLAMYDOPHILA PNEUMONIAE-RVPPCR: NOT DETECTED
CORONAVIRUS 229E-RVPPCR: NOT DETECTED
CORONAVIRUS HKU1-RVPPCR: NOT DETECTED
CORONAVIRUS NL63-RVPPCR: NOT DETECTED
Coronavirus OC43: NOT DETECTED
Influenza A: NOT DETECTED
Influenza B: NOT DETECTED
MYCOPLASMA PNEUMONIAE-RVPPCR: NOT DETECTED
Metapneumovirus: NOT DETECTED
PARAINFLUENZA VIRUS 3-RVPPCR: NOT DETECTED
Parainfluenza Virus 1: NOT DETECTED
Parainfluenza Virus 2: NOT DETECTED
Parainfluenza Virus 4: NOT DETECTED
RHINOVIRUS / ENTEROVIRUS - RVPPCR: DETECTED — AB
Respiratory Syncytial Virus: NOT DETECTED

## 2017-09-07 LAB — HIV ANTIBODY (ROUTINE TESTING W REFLEX): HIV SCREEN 4TH GENERATION: NONREACTIVE

## 2017-09-07 MED ORDER — IPRATROPIUM-ALBUTEROL 0.5-2.5 (3) MG/3ML IN SOLN
3.0000 mL | Freq: Four times a day (QID) | RESPIRATORY_TRACT | Status: DC
  Administered 2017-09-07 – 2017-09-08 (×5): 3 mL via RESPIRATORY_TRACT
  Filled 2017-09-07 (×5): qty 3

## 2017-09-07 NOTE — Progress Notes (Signed)
PROGRESS NOTE  William BoltJames I [name] RUE:[id number] DOB: 1981/01/02 DOA: 09/06/2017 PCP: Patient, No Pcp Per   LOS: 0 days   Brief Narrative / Interim history: 36 year-old male with PMH significant for asthma and HTN presents with 2 days of increasing SOB and chest tightness with non productive cough and wheezing. Pt was seen in the ED on 10/31 and discharged home with steroids and albuterol inhaler, but symptoms returned and have been refractory to MDI use. No f/c, myalgias. In ED pt was given nebulizers, magnesium, and prednisone and actually required BiPAP for a short time. Pt admitted for asthma exacerbation.  Assessment & Plan: Principal Problem:   Asthma in adult, mild persistent, with status asthmaticus Active Problems:   HTN (hypertension)   Acute asthma exacerbation with hypoxic respiratory failure Pt presented with wheezing, SOB, non-productive cough, chest tightness. +Rhinovirus, likely cause of exacerbation. Improved today, but still with extensive wheezing, decreased air movement, and prolonged expiratory phase. Pt hypoxic this morning, requiring supplemental O2. Will continue Duonebs q6, albuterol PRN, prednisone, pulmicort, and tessalon.   Mild hypokalemia Likely 2/2 albuterol. Will replete and recheck in the morning.   Hypertension BP stable. Pt with dx of HTN, was on HCTZ but discontinued due to headache. No role for inpatient management at this point. Will need outpatient f/u.    DVT prophylaxis: lovenox Code Status: full code Family Communication: not at bedside Disposition Plan: home  Consultants:     Procedures:     Antimicrobials:     Subjective: Pt feeling better but still with some SOB. No cp or n/v.   Objective: Vitals:   09/07/17 0508 09/07/17 0735 09/07/17 0737 09/07/17 1249  BP: (!) 157/90     Pulse: 78     Resp: 19     Temp: 98.2 F (36.8 C)     TempSrc: Oral     SpO2: 92% 90% 90% 93%  Weight:      Height:        Intake/Output Summary  (Last 24 hours) at 09/07/2017 1324 Last data filed at 09/07/2017 1000 Gross per 24 hour  Intake 1650 ml  Output -  Net 1650 ml   Filed Weights   09/07/17 0139  Weight: 82.7 kg (182 lb 4.8 oz)    Examination:  Constitutional: NAD Eyes:  lids and conjunctivae normal ENMT: Mucous membranes are moist.  Neck: normal, supple, no masses Respiratory: diffuse wheezing and decreased air movement. No rales or rhonchi. No accessory muscle use.  Cardiovascular: Regular rate and rhythm, no murmurs / rubs / gallops. No LE edema. 2+ pedal pulses. No carotid bruits.  Abdomen: no tenderness. Bowel sounds positive.  Musculoskeletal: no clubbing / cyanosis. No joint deformity upper and lower extremities. No contractures. Normal muscle tone.  Skin: no rashes, lesions, ulcers.  Neurologic: Grossly non-focal  Psychiatric: Normal judgment and insight. Alert and oriented x 3. Normal mood.    Data Reviewed: I have independently reviewed following labs and imaging studies   CBC: Recent Labs  Lab 09/06/17 1951 09/06/17 2002  WBC 11.4*  --   NEUTROABS 10.3*  --   HGB 14.9 16.3  HCT 44.6 48.0  MCV 88.7  --   PLT 251  --    Basic Metabolic Panel: Recent Labs  Lab 09/06/17 2002  NA 139  K 3.2*  CL 105  GLUCOSE 140*  BUN 14  CREATININE 1.00   GFR: Estimated Creatinine Clearance: 102.1 mL/min (by C-G formula based on SCr of 1 mg/dL). Liver Function  Tests: No results for input(s): AST, ALT, ALKPHOS, BILITOT, PROT, ALBUMIN in the last 168 hours. No results for input(s): LIPASE, AMYLASE in the last 168 hours. No results for input(s): AMMONIA in the last 168 hours. Coagulation Profile: No results for input(s): INR, PROTIME in the last 168 hours. Cardiac Enzymes: No results for input(s): CKTOTAL, CKMB, CKMBINDEX, TROPONINI in the last 168 hours. BNP (last 3 results) No results for input(s): PROBNP in the last 8760 hours. HbA1C: No results for input(s): HGBA1C in the last 72 hours. CBG: No  results for input(s): GLUCAP in the last 168 hours. Lipid Profile: No results for input(s): CHOL, HDL, LDLCALC, TRIG, CHOLHDL, LDLDIRECT in the last 72 hours. Thyroid Function Tests: No results for input(s): TSH, T4TOTAL, FREET4, T3FREE, THYROIDAB in the last 72 hours. Anemia Panel: No results for input(s): VITAMINB12, FOLATE, FERRITIN, TIBC, IRON, RETICCTPCT in the last 72 hours. Urine analysis:    Component Value Date/Time   COLORURINE YELLOW 04/06/2007 0814   APPEARANCEUR CLEAR 04/06/2007 0814   LABSPEC 1.021 04/06/2007 0814   PHURINE 5.5 04/06/2007 0814   GLUCOSEU NEGATIVE 04/06/2007 0814   HGBUR NEGATIVE 04/06/2007 0814   BILIRUBINUR NEGATIVE 04/06/2007 0814   KETONESUR NEGATIVE 04/06/2007 0814   PROTEINUR NEGATIVE 04/06/2007 0814   UROBILINOGEN 0.2 04/06/2007 0814   NITRITE NEGATIVE 04/06/2007 0814   LEUKOCYTESUR  04/06/2007 0814    NEGATIVE MICROSCOPIC NOT DONE ON URINES WITH NEGATIVE PROTEIN, BLOOD, LEUKOCYTES, NITRITE, OR GLUCOSE <1000 mg/dL.   Sepsis Labs: Invalid input(s): PROCALCITONIN, LACTICIDVEN  Recent Results (from the past 240 hour(s))  Respiratory Panel by PCR     Status: Abnormal   Collection Time: 09/06/17  8:06 PM  Result Value Ref Range Status   Adenovirus NOT DETECTED NOT DETECTED Final   Coronavirus 229E NOT DETECTED NOT DETECTED Final   Coronavirus HKU1 NOT DETECTED NOT DETECTED Final   Coronavirus NL63 NOT DETECTED NOT DETECTED Final   Coronavirus OC43 NOT DETECTED NOT DETECTED Final   Metapneumovirus NOT DETECTED NOT DETECTED Final   Rhinovirus / Enterovirus DETECTED (A) NOT DETECTED Final   Influenza A NOT DETECTED NOT DETECTED Final   Influenza B NOT DETECTED NOT DETECTED Final   Parainfluenza Virus 1 NOT DETECTED NOT DETECTED Final   Parainfluenza Virus 2 NOT DETECTED NOT DETECTED Final   Parainfluenza Virus 3 NOT DETECTED NOT DETECTED Final   Parainfluenza Virus 4 NOT DETECTED NOT DETECTED Final   Respiratory Syncytial Virus NOT DETECTED NOT  DETECTED Final   Bordetella pertussis NOT DETECTED NOT DETECTED Final   Chlamydophila pneumoniae NOT DETECTED NOT DETECTED Final   Mycoplasma pneumoniae NOT DETECTED NOT DETECTED Final      Radiology Studies: Dg Chest 2 View  Result Date: 09/06/2017 CLINICAL DATA:  Chest pain and shortness of Breath EXAM: CHEST  2 VIEW COMPARISON:  08/22/2017 FINDINGS: Melene MullerShadow is within normal limits. The lungs are again hyperinflated but stable. No focal infiltrate or sizable effusion is seen. No acute bony abnormality is noted. IMPRESSION: Persistent hyperinflation consistent with the given clinical history. No acute abnormality is noted. Electronically Signed   By: Alcide CleverMark  Lukens M.D.   On: 09/06/2017 16:21     Scheduled Meds: . benzonatate  200 mg Oral TID  . budesonide (PULMICORT) nebulizer solution  0.25 mg Nebulization BID  . enoxaparin (LOVENOX) injection  40 mg Subcutaneous Daily  . ipratropium-albuterol  3 mL Nebulization Q6H  . predniSONE  50 mg Oral Q breakfast   Continuous Infusions:     Time spent:  Elinor Dodge, PA-S 09/07/2017, 1:24 PM   @[email or web address] @

## 2017-09-07 NOTE — Progress Notes (Signed)
Respiratory panel resulted. Rhinovirus/Enterovirus detected. Craige CottaKirby, NP notified.

## 2017-09-08 DIAGNOSIS — J4532 Mild persistent asthma with status asthmaticus: Principal | ICD-10-CM

## 2017-09-08 MED ORDER — GUAIFENESIN ER 600 MG PO TB12
600.0000 mg | ORAL_TABLET | Freq: Two times a day (BID) | ORAL | Status: DC
  Administered 2017-09-08: 600 mg via ORAL
  Filled 2017-09-08: qty 1

## 2017-09-08 MED ORDER — GUAIFENESIN ER 600 MG PO TB12: 600.0000 mg | ORAL_TABLET | Freq: Two times a day (BID) | ORAL | 2 refills | Status: AC

## 2017-09-08 MED ORDER — PREDNISONE 10 MG PO TABS: ORAL_TABLET | ORAL | 0 refills | Status: DC

## 2017-09-08 MED ORDER — BENZONATATE 100 MG PO CAPS: 100.0000 mg | ORAL_CAPSULE | Freq: Three times a day (TID) | ORAL | 0 refills | Status: DC | PRN

## 2017-09-08 MED ORDER — INFLUENZA VAC SPLIT QUAD 0.5 ML IM SUSY
0.5000 mL | PREFILLED_SYRINGE | INTRAMUSCULAR | Status: AC
  Administered 2017-09-08: 0.5 mL via INTRAMUSCULAR

## 2017-09-08 MED ORDER — FLUTICASONE PROPIONATE HFA 44 MCG/ACT IN AERO: 2.0000 | INHALATION_SPRAY | Freq: Two times a day (BID) | RESPIRATORY_TRACT | 2 refills | Status: DC

## 2017-09-08 NOTE — Discharge Summary (Signed)
Discharge Summary  William BoltJames I Kraai HYQ:[id number] DOB: Dec 26, 1980  PCP: Patient, No Pcp Per  Admit date: 09/06/2017 Discharge date: 09/08/2017  Time spent: <2130mins  Recommendations for Outpatient Follow-up:  1. F/u with PMD within a week  for hospital discharge follow up, repeat cbc/bmp at follow up  Discharge Diagnoses:  Active Hospital Problems   Diagnosis Date Noted  . Asthma in adult, mild persistent, with status asthmaticus 09/06/2017  . HTN (hypertension) 09/06/2017    Resolved Hospital Problems  No resolved problems to display.    Discharge Condition: stable  Diet recommendation: regular diet  Filed Weights   09/07/17 0139  Weight: 82.7 kg (182 lb 4.8 oz)    History of present illness:  Chief Complaint: Asthma exacerbation  HPI: William [name] is a 36 y.o. male with medical history significant of HTN, asthma.  Presents to the ED with c/o asthma exacerbation.  Recently seen on 10/31.  Discharged on 4 days steroids and rescue inhaler.  Finished steroids, but asthma symptoms have reoccurred now.  For past 2 days has had non-productive cough, wheezing, SOB.  Denies fever, chills, body aches.  Using rescue inhaler every 15 mins hasnt helped.   ED Course: Albuterol, mag, prednisone, initially put on BIPAP, now breathing much better after hour long nebs.    Hospital Course:  Principal Problem:   Asthma in adult, mild persistent, with status asthmaticus Active Problems:   HTN (hypertension)  Acute asthma exacerbation/Acute hypoxic respiratory failure --Respiratory virus panel positive for rhinovirus which is likely causing very for his exacerbation -cxr " chronic Persistent hyperinflation , no infiltrate" -patient initially with significant dyspnea, tachycardia -he is treated with steroids/nebs, much improved, hypoxia resolved, he is on room air, no dyspnea.  -wheezing has much improved, he wants to go home for his son's birthday today. -he is discharged on  flovent/albuterol/prednisone taper    Hypertension Report HCTZ allergy Follow up with pmd    Procedures:  none  Consultations:  none  Discharge Exam: BP (!) 149/98 (BP Location: Left Arm)   Pulse 73   Temp 98 F (36.7 C) (Oral)   Resp 18   Ht 5\' 9"  (1.753 m)   Wt 82.7 kg (182 lb 4.8 oz)   SpO2 99%   BMI 26.92 kg/m   General: NAD Cardiovascular: RRR Respiratory: mild scattered wheezing  Discharge Instructions You were cared for by a hospitalist during your hospital stay. If you have any questions about your discharge medications or the care you received while you were in the hospital after you are discharged, you can call the unit and asked to speak with the hospitalist on call if the hospitalist that took care of you is not available. Once you are discharged, your primary care physician will handle any further medical issues. Please note that NO REFILLS for any discharge medications will be authorized once you are discharged, as it is imperative that you return to your primary care physician (or establish a relationship with a primary care physician if you do not have one) for your aftercare needs so that they can reassess your need for medications and monitor your lab values.   Allergies as of 09/08/2017      Reactions   Hydrochlorothiazide Other (See Comments)   Causes headaches      Medication List    TAKE these medications   albuterol 108 (90 Base) MCG/ACT inhaler Commonly known as:  PROVENTIL HFA;VENTOLIN HFA Inhale 1-2 puffs into the lungs every 6 (six) hours as  needed for wheezing or shortness of breath. What changed:  how much to take   albuterol (2.5 MG/3ML) 0.083% nebulizer solution Commonly known as:  PROVENTIL Take 3 mLs (2.5 mg total) by nebulization every 6 (six) hours as needed for wheezing or shortness of breath. What changed:  Another medication with the same name was changed. Make sure you understand how and when to take each.   benzonatate  100 MG capsule Commonly known as:  TESSALON Take 2 capsules (200 mg total) by mouth every 8 (eight) hours.   fluticasone 44 MCG/ACT inhaler Commonly known as:  FLOVENT HFA Inhale 2 puffs 2 (two) times daily into the lungs.   predniSONE 10 MG tablet Commonly known as:  DELTASONE Label  & dispense according to the schedule below. 4Pills PO on day one then, 3 Pills PO on day two, 2Pills PO on day three, 1 Pill PO on day four, then STOP.  Total of 10  tabs   TUSSIN CHEST CONGESTION 100 MG/5ML syrup Generic drug:  guaifenesin Take 200 mg 3 (three) times daily as needed by mouth for cough or congestion. What changed:  Another medication with the same name was added. Make sure you understand how and when to take each.   guaiFENesin 600 MG 12 hr tablet Commonly known as:  MUCINEX Take 1 tablet (600 mg total) 2 (two) times daily by mouth. What changed:  You were already taking a medication with the same name, and this prescription was added. Make sure you understand how and when to take each.      Allergies  Allergen Reactions  . Hydrochlorothiazide Other (See Comments)    Causes headaches   Follow-up Information    [facility] Follow up in 1 week(s).   Why:  hospital discharge follow up Contact information: [address] [facility] [place] [id number] [phone]           The results of significant diagnostics from this hospitalization (including imaging, microbiology, ancillary and laboratory) are listed below for reference.    Significant Diagnostic Studies: Dg Chest 2 View  Result Date: 09/06/2017 CLINICAL DATA:  Chest pain and shortness of Breath EXAM: CHEST  2 VIEW COMPARISON:  08/22/2017 FINDINGS: Melene MullerShadow is within normal limits. The lungs are again hyperinflated but stable. No focal infiltrate or sizable effusion is seen. No acute bony abnormality is noted. IMPRESSION: Persistent hyperinflation consistent with the given  clinical history. No acute abnormality is noted. Electronically Signed   By: Alcide CleverMark  Lukens M.D.   On: 09/06/2017 16:21   Dg Chest 2 View  Result Date: 08/22/2017 CLINICAL DATA:  One day of cough, shortness of breath, and chest tightness. Former smoker. No history of asthma or known diagnosis of COPD. EXAM: CHEST  2 VIEW COMPARISON:  PA and lateral chest x-ray of April 13, 2017 FINDINGS: The lungs are mildly hyperinflated and clear. The heart and pulmonary vascularity are normal. The mediastinum is normal in width. There is no pleural effusion. The bony thorax is unremarkable. IMPRESSION: Hyperinflation consistent with reactive airway disease or the patient's smoking history. No pneumonia, CHF, nor other acute cardiopulmonary abnormality. Electronically Signed   By: David  SwazilandJordan M.D.   On: 08/22/2017 14:41    Microbiology: Recent Results (from the past 240 hour(s))  Respiratory Panel by PCR     Status: Abnormal   Collection Time: 09/06/17  8:06 PM  Result Value Ref Range Status   Adenovirus NOT DETECTED NOT DETECTED Final  Coronavirus 229E NOT DETECTED NOT DETECTED Final   Coronavirus HKU1 NOT DETECTED NOT DETECTED Final   Coronavirus NL63 NOT DETECTED NOT DETECTED Final   Coronavirus OC43 NOT DETECTED NOT DETECTED Final   Metapneumovirus NOT DETECTED NOT DETECTED Final   Rhinovirus / Enterovirus DETECTED (A) NOT DETECTED Final   Influenza A NOT DETECTED NOT DETECTED Final   Influenza B NOT DETECTED NOT DETECTED Final   Parainfluenza Virus 1 NOT DETECTED NOT DETECTED Final   Parainfluenza Virus 2 NOT DETECTED NOT DETECTED Final   Parainfluenza Virus 3 NOT DETECTED NOT DETECTED Final   Parainfluenza Virus 4 NOT DETECTED NOT DETECTED Final   Respiratory Syncytial Virus NOT DETECTED NOT DETECTED Final   Bordetella pertussis NOT DETECTED NOT DETECTED Final   Chlamydophila pneumoniae NOT DETECTED NOT DETECTED Final   Mycoplasma pneumoniae NOT DETECTED NOT DETECTED Final     Labs: Basic  Metabolic Panel: Recent Labs  Lab 09/06/17 2002  NA 139  K 3.2*  CL 105  GLUCOSE 140*  BUN 14  CREATININE 1.00   Liver Function Tests: No results for input(s): AST, ALT, ALKPHOS, BILITOT, PROT, ALBUMIN in the last 168 hours. No results for input(s): LIPASE, AMYLASE in the last 168 hours. No results for input(s): AMMONIA in the last 168 hours. CBC: Recent Labs  Lab 09/06/17 1951 09/06/17 2002  WBC 11.4*  --   NEUTROABS 10.3*  --   HGB 14.9 16.3  HCT 44.6 48.0  MCV 88.7  --   PLT 251  --    Cardiac Enzymes: No results for input(s): CKTOTAL, CKMB, CKMBINDEX, TROPONINI in the last 168 hours. BNP: BNP (last 3 results) No results for input(s): BNP in the last 8760 hours.  ProBNP (last 3 results) No results for input(s): PROBNP in the last 8760 hours.  CBG: No results for input(s): GLUCAP in the last 168 hours.     Signed:  Albertine Grates MD, PhD  [facility] 09/08/2017, 11:16 AM

## 2018-01-03 ENCOUNTER — Emergency Department (HOSPITAL_COMMUNITY): Payer: Self-pay

## 2018-01-03 ENCOUNTER — Encounter (HOSPITAL_COMMUNITY): Payer: Self-pay | Admitting: *Deleted

## 2018-01-03 ENCOUNTER — Emergency Department (HOSPITAL_COMMUNITY)
Admission: EM | Admit: 2018-01-03 | Discharge: 2018-01-03 | Disposition: A | Discharge status: Home / Self Care | Payer: Self-pay | Attending: Emergency Medicine | Admitting: Emergency Medicine

## 2018-01-03 ENCOUNTER — Other Ambulatory Visit: Payer: Self-pay

## 2018-01-03 DIAGNOSIS — J45901 Unspecified asthma with (acute) exacerbation: Secondary | ICD-10-CM | POA: Insufficient documentation

## 2018-01-03 DIAGNOSIS — I10 Essential (primary) hypertension: Secondary | ICD-10-CM | POA: Insufficient documentation

## 2018-01-03 DIAGNOSIS — Z87891 Personal history of nicotine dependence: Secondary | ICD-10-CM | POA: Insufficient documentation

## 2018-01-03 MED ORDER — PREDNISONE 20 MG PO TABS
60.0000 mg | ORAL_TABLET | Freq: Once | ORAL | Status: AC
Start: 2018-01-03 — End: 2018-01-03
  Administered 2018-01-03: 60 mg via ORAL
  Filled 2018-01-03: qty 3

## 2018-01-03 MED ORDER — ALBUTEROL SULFATE (2.5 MG/3ML) 0.083% IN NEBU
5.0000 mg | INHALATION_SOLUTION | Freq: Once | RESPIRATORY_TRACT | Status: AC
  Administered 2018-01-03: 5 mg via RESPIRATORY_TRACT
  Filled 2018-01-03: qty 6

## 2018-01-03 MED ORDER — IPRATROPIUM-ALBUTEROL 0.5-2.5 (3) MG/3ML IN SOLN
3.0000 mL | Freq: Once | RESPIRATORY_TRACT | Status: AC
  Administered 2018-01-03: 3 mL via RESPIRATORY_TRACT
  Filled 2018-01-03: qty 3

## 2018-01-03 MED ORDER — ALBUTEROL SULFATE HFA 108 (90 BASE) MCG/ACT IN AERS
2.0000 | INHALATION_SPRAY | RESPIRATORY_TRACT | Status: DC | PRN
  Administered 2018-01-03: 2 via RESPIRATORY_TRACT
  Filled 2018-01-03: qty 6.7

## 2018-01-03 MED ORDER — PREDNISONE 10 MG (21) PO TBPK: ORAL_TABLET | ORAL | 0 refills | Status: DC

## 2018-01-03 MED ORDER — BENZONATATE 100 MG PO CAPS: 100.0000 mg | ORAL_CAPSULE | Freq: Three times a day (TID) | ORAL | 0 refills | Status: DC

## 2018-01-03 NOTE — ED Triage Notes (Signed)
Pt has been having wheezing for the past two days. SOB resolved with inhaler; pt ran out of inhaler. Wheezing noted in all fields

## 2018-01-03 NOTE — ED Provider Notes (Signed)
[facility] Provider Note   CSN: [id number] Arrival date & time: 01/03/18  2121     History   Chief Complaint Chief Complaint  Patient presents with  . Asthma    HPI William [name] is a 37 y.o. male who presents to the ED with asthma attack. Patient reports he started wheezing 2 days ago and is out of his inhaler. Patient denies fever, chills or other problems. Patient is a former tobacco smoker and smokes marijuana.  HPI  Past Medical History:  Diagnosis Date  . Asthma   . Hypertension     Patient Active Problem List   Diagnosis Date Noted  . HTN (hypertension) 09/06/2017  . Asthma in adult, mild persistent, with status asthmaticus 09/06/2017    Past Surgical History:  Procedure Laterality Date  . skull surgery         Home Medications    Prior to Admission medications   Medication Sig Start Date End Date Taking? Authorizing Provider  guaifenesin (TUSSIN CHEST CONGESTION) 100 MG/5ML syrup Take 200 mg 3 (three) times daily as needed by mouth for cough or congestion.   Yes [provider]  benzonatate (TESSALON) 100 MG capsule Take 1 capsule (100 mg total) by mouth every 8 (eight) hours. 01/03/18   Janne NapoleonNeese, Alba Perillo M, NP  fluticasone (FLOVENT HFA) 44 MCG/ACT inhaler Inhale 2 puffs 2 (two) times daily into the lungs. Patient not taking: Reported on 01/03/2018 09/08/17 09/08/18  Albertine GratesXu, Fang, MD  guaiFENesin (MUCINEX) 600 MG 12 hr tablet Take 1 tablet (600 mg total) 2 (two) times daily by mouth. Patient not taking: Reported on 01/03/2018 09/08/17 09/08/18  Albertine GratesXu, Fang, MD  predniSONE (STERAPRED UNI-PAK 21 TAB) 10 MG (21) TBPK tablet Starting 01/04/2018 take 6 tablets first day then 5, 4, 3, 2, 1 01/03/18   Janne NapoleonNeese, Daivon Rayos M, NP    Family History No family history on file.  Social History Social History   Tobacco Use  . Smoking status: Former Games developermoker  . Smokeless tobacco: Never Used  Substance Use Topics  . Alcohol use: Yes  . Drug  use: Yes    Types: Marijuana     Allergies   Hydrochlorothiazide   Review of Systems Review of Systems  Constitutional: Negative for fever.  HENT: Negative.   Respiratory: Positive for cough, shortness of breath and wheezing.   Gastrointestinal: Negative for abdominal pain, nausea and vomiting.  Musculoskeletal: Negative for myalgias.  Skin: Negative for rash.  Neurological: Negative for headaches.  Psychiatric/Behavioral: Negative for confusion.     Physical Exam Updated Vital Signs BP (!) 134/96 (BP Location: Right Arm)   Pulse (!) 102   Temp 98.2 F (36.8 C) (Oral)   Resp 18   Ht 5\' 9"  (1.753 m)   Wt 83.9 kg (185 lb)   SpO2 91%   BMI 27.32 kg/m   Physical Exam  Constitutional: He is oriented to person, place, and time. He appears well-developed and well-nourished. No distress.  HENT:  Head: Normocephalic and atraumatic.  Eyes: EOM are normal.  Neck: Normal range of motion. Neck supple.  Cardiovascular: Tachycardia present.  Pulmonary/Chest: Effort normal. He has decreased breath sounds. He has wheezes.  Abdominal: Soft. There is no tenderness.  Musculoskeletal: Normal range of motion.  Lymphadenopathy:    He has no cervical adenopathy.  Neurological: He is alert and oriented to person, place, and time. No cranial nerve deficit.  Skin: Skin is warm and dry.  Psychiatric: He has a  normal mood and affect.  Nursing note and vitals reviewed.    ED Treatments / Results  Labs (all labs ordered are listed, but only abnormal results are displayed) Labs Reviewed - No data to display  Radiology Dg Chest 2 View  Result Date: 01/03/2018 CLINICAL DATA:  Wheezing the past 2 days EXAM: CHEST - 2 VIEW COMPARISON:  09/06/2017 FINDINGS: The heart size and mediastinal contours are within normal limits. Both lungs are clear. The visualized skeletal structures are unremarkable. IMPRESSION: No active cardiopulmonary disease. Electronically Signed   By: Tollie Eth M.D.   On:  01/03/2018 22:49    Procedures Procedures (including critical care time)  Medications Ordered in ED Medications  albuterol (PROVENTIL HFA;VENTOLIN HFA) 108 (90 Base) MCG/ACT inhaler 2 puff (2 puffs Inhalation Given 01/03/18 2237)  albuterol (PROVENTIL) (2.5 MG/3ML) 0.083% nebulizer solution 5 mg (5 mg Nebulization Given 01/03/18 2139)  ipratropium-albuterol (DUONEB) 0.5-2.5 (3) MG/3ML nebulizer solution 3 mL (3 mLs Nebulization Given 01/03/18 2157)  predniSONE (DELTASONE) tablet 60 mg (60 mg Oral Given 01/03/18 2237)   Re examined after second breathing treatment and lungs have improved. Patient reports breathing easier.   Initial Impression / Assessment and Plan / ED Course  I have reviewed the triage vital signs and the nursing notes. 37 y.o. male here with asthma and request for medication refill. Chest x-ray without pneumonia. Patient given albuterol inhaler prior to d/c, prednisone and return precautions. Patient agrees to f/u plan.   Final Clinical Impressions(s) / ED Diagnoses   Final diagnoses:  Moderate asthma with exacerbation, unspecified whether persistent    ED Discharge Orders        Ordered    predniSONE (STERAPRED UNI-PAK 21 TAB) 10 MG (21) TBPK tablet     01/03/18 2305    benzonatate (TESSALON) 100 MG capsule  Every 8 hours     01/03/18 2305       Kerrie Buffalo [place], NP 01/04/18 0139    Tilden Fossa, MD 01/05/18 [phone]

## 2018-01-03 NOTE — Discharge Instructions (Signed)
Follow up with your primary care doctor for your asthma and for your BP. Your blood pressure was elevated tonight.

## 2018-01-03 NOTE — ED Notes (Signed)
Pt offered additional breathing treatment; declined stating he feels better; Pt ambulatory with steady gait to lobby

## 2018-03-26 ENCOUNTER — Other Ambulatory Visit: Payer: Self-pay

## 2018-03-26 ENCOUNTER — Emergency Department (HOSPITAL_COMMUNITY)
Admission: EM | Admit: 2018-03-26 | Discharge: 2018-03-27 | Disposition: A | Discharge status: Home / Self Care | Payer: Self-pay | Attending: Emergency Medicine | Admitting: Emergency Medicine

## 2018-03-26 DIAGNOSIS — R062 Wheezing: Secondary | ICD-10-CM | POA: Insufficient documentation

## 2018-03-26 DIAGNOSIS — K0889 Other specified disorders of teeth and supporting structures: Secondary | ICD-10-CM | POA: Insufficient documentation

## 2018-03-26 DIAGNOSIS — I10 Essential (primary) hypertension: Secondary | ICD-10-CM | POA: Insufficient documentation

## 2018-03-26 DIAGNOSIS — Z87891 Personal history of nicotine dependence: Secondary | ICD-10-CM | POA: Insufficient documentation

## 2018-03-26 MED ORDER — ALBUTEROL SULFATE (2.5 MG/3ML) 0.083% IN NEBU
5.0000 mg | INHALATION_SOLUTION | Freq: Once | RESPIRATORY_TRACT | Status: AC
  Administered 2018-03-26: 5 mg via RESPIRATORY_TRACT
  Filled 2018-03-26: qty 6

## 2018-03-26 MED ORDER — NAPROXEN 250 MG PO TABS
500.0000 mg | ORAL_TABLET | Freq: Once | ORAL | Status: AC
  Administered 2018-03-26: 500 mg via ORAL
  Filled 2018-03-26: qty 2

## 2018-03-26 MED ORDER — IPRATROPIUM BROMIDE 0.02 % IN SOLN
0.5000 mg | Freq: Once | RESPIRATORY_TRACT | Status: AC
  Administered 2018-03-26: 0.5 mg via RESPIRATORY_TRACT
  Filled 2018-03-26: qty 2.5

## 2018-03-26 NOTE — ED Provider Notes (Signed)
[facility] EMERGENCY DEPARTMENT Provider Note   CSN: [id number] Arrival date & time: 03/26/18  2106     History   Chief Complaint Chief Complaint  Patient presents with  . Dental Pain    HPI William [name] is a 36 y.o. male.  Patient reports right upper tooth pain onset on Sunday. He states the tooth broke off over one month ago. Patient reports pain has increased since onset, minimal to no response to ibuprofen. Mild facial swelling. Patient also reports increase in wheezing over the last few days. Occasional cough, non-productive. No fevers.    Dental Pain   This is a recurrent problem. The current episode started 2 days ago. The problem occurs constantly. The problem has been gradually worsening. The pain is moderate. Treatments tried: ibuprofen. The treatment provided no relief.  Wheezing   This is a recurrent problem. The current episode started yesterday. The problem has been gradually worsening. Pertinent negatives include no fever. It is unknown what precipitated the problem. He has tried beta-agonist inhalers for the symptoms. The treatment provided mild relief. He has had no prior hospitalizations. He has had prior ED visits. He has had no prior ICU admissions. His past medical history is significant for asthma.    Past Medical History:  Diagnosis Date  . Asthma   . Hypertension     Patient Active Problem List   Diagnosis Date Noted  . HTN (hypertension) 09/06/2017  . Asthma in adult, mild persistent, with status asthmaticus 09/06/2017    Past Surgical History:  Procedure Laterality Date  . skull surgery          Home Medications    Prior to Admission medications   Medication Sig Start Date End Date Taking? Authorizing Provider  benzonatate (TESSALON) 100 MG capsule Take 1 capsule (100 mg total) by mouth every 8 (eight) hours. 01/03/18   Janne Napoleon, NP  fluticasone (FLOVENT HFA) 44 MCG/ACT inhaler Inhale 2 puffs 2 (two) times daily  into the lungs. Patient not taking: Reported on 01/03/2018 09/08/17 09/08/18  Albertine Grates, MD  guaiFENesin (MUCINEX) 600 MG 12 hr tablet Take 1 tablet (600 mg total) 2 (two) times daily by mouth. Patient not taking: Reported on 01/03/2018 09/08/17 09/08/18  Albertine Grates, MD  guaifenesin (TUSSIN CHEST CONGESTION) 100 MG/5ML syrup Take 200 mg 3 (three) times daily as needed by mouth for cough or congestion.    [provider]  predniSONE (STERAPRED UNI-PAK 21 TAB) 10 MG (21) TBPK tablet Starting 01/04/2018 take 6 tablets first day then 5, 4, 3, 2, 1 01/03/18   Janne Napoleon, NP    Family History No family history on file.  Social History Social History   Tobacco Use  . Smoking status: Former Games developer  . Smokeless tobacco: Never Used  Substance Use Topics  . Alcohol use: Yes  . Drug use: Yes    Types: Marijuana     Allergies   Hydrochlorothiazide   Review of Systems Review of Systems  Constitutional: Negative for fever.  HENT: Positive for dental problem.   Respiratory: Positive for wheezing.   All other systems reviewed and are negative.    Physical Exam Updated Vital Signs BP (!) 169/98 (BP Location: Right Arm)   Pulse 89   Temp 98.3 F (36.8 C) (Oral)   Resp 16   Ht 5' 9.75" (1.772 m)   Wt 83.9 kg (185 lb)   SpO2 97%   BMI 26.74 kg/m   Physical Exam  Constitutional: He is oriented to person, place, and time. He appears well-developed and well-nourished.  HENT:  Head: Normocephalic.  Mouth/Throat: Mucous membranes are normal.    Tooth broken/missing at gumline at site of pain.  Eyes: Conjunctivae are normal.  Neck: Neck supple.  Cardiovascular: Normal rate and regular rhythm.  Pulmonary/Chest: Effort normal and breath sounds normal.  Abdominal: Soft.  Musculoskeletal: Normal range of motion. He exhibits no edema.  Lymphadenopathy:    He has no cervical adenopathy.  Neurological: He is alert and oriented to person, place, and time.  Skin: Skin is dry.    Psychiatric: He has a normal mood and affect.  Nursing note and vitals reviewed.    ED Treatments / Results  Labs (all labs ordered are listed, but only abnormal results are displayed) Labs Reviewed - No data to display  EKG None  Radiology No results found.  Procedures Procedures (including critical care time)  Medications Ordered in ED Medications - No data to display   Initial Impression / Assessment and Plan / ED Course  I have reviewed the triage vital signs and the nursing notes.  Pertinent labs & imaging results that were available during my care of the patient were reviewed by me and considered in my medical decision making (see chart for details).     Patient with dentalgia.  No abscess requiring immediate incision and drainage.  Exam not concerning for Ludwig's angina or pharyngeal abscess.  Will treat with naproxen and amoxicillin. Pt instructed to follow-up with dentist.  Discussed return precautions. Pt safe for discharge.  Final Clinical Impressions(s) / ED Diagnoses   Final diagnoses:  Pain, dental    ED Discharge Orders        Ordered    amoxicillin (AMOXIL) 500 MG capsule  3 times daily     03/27/18 0016    naproxen (NAPROSYN) 375 MG tablet  2 times daily     03/27/18 0016       Felicie MornSmith, Dennard Vezina, NP 03/27/18 0130    Little, Ambrose Finlandachel Morgan, MD 03/27/18 [phone]

## 2018-03-26 NOTE — ED Triage Notes (Signed)
Patient c/o dental pain that began Sunday; states that he broke off his tooth one month ago.

## 2018-03-26 NOTE — ED Notes (Signed)
ED Provider at bedside. 

## 2018-03-27 MED ORDER — AMOXICILLIN 500 MG PO CAPS: 500.0000 mg | ORAL_CAPSULE | Freq: Three times a day (TID) | ORAL | 0 refills | Status: DC

## 2018-03-27 MED ORDER — NAPROXEN 375 MG PO TABS: 375.0000 mg | ORAL_TABLET | Freq: Two times a day (BID) | ORAL | 0 refills | Status: DC

## 2018-03-27 MED ORDER — ALBUTEROL SULFATE HFA 108 (90 BASE) MCG/ACT IN AERS
2.0000 | INHALATION_SPRAY | RESPIRATORY_TRACT | Status: DC | PRN
  Administered 2018-03-27: 2 via RESPIRATORY_TRACT
  Filled 2018-03-27: qty 6.7

## 2018-03-27 NOTE — ED Notes (Signed)
Pt departed in NAD, refused use of wheelchair.  

## 2018-05-09 ENCOUNTER — Emergency Department (HOSPITAL_COMMUNITY)
Admission: EM | Admit: 2018-05-09 | Discharge: 2018-05-09 | Disposition: A | Discharge status: Home / Self Care | Payer: Self-pay | Attending: Emergency Medicine | Admitting: Emergency Medicine

## 2018-05-09 ENCOUNTER — Emergency Department (HOSPITAL_COMMUNITY): Payer: Self-pay

## 2018-05-09 ENCOUNTER — Other Ambulatory Visit: Payer: Self-pay

## 2018-05-09 ENCOUNTER — Encounter (HOSPITAL_COMMUNITY): Payer: Self-pay | Admitting: Emergency Medicine

## 2018-05-09 DIAGNOSIS — J4521 Mild intermittent asthma with (acute) exacerbation: Secondary | ICD-10-CM | POA: Insufficient documentation

## 2018-05-09 DIAGNOSIS — K0889 Other specified disorders of teeth and supporting structures: Secondary | ICD-10-CM | POA: Insufficient documentation

## 2018-05-09 DIAGNOSIS — Z79899 Other long term (current) drug therapy: Secondary | ICD-10-CM | POA: Insufficient documentation

## 2018-05-09 DIAGNOSIS — K089 Disorder of teeth and supporting structures, unspecified: Secondary | ICD-10-CM

## 2018-05-09 DIAGNOSIS — G8929 Other chronic pain: Secondary | ICD-10-CM

## 2018-05-09 DIAGNOSIS — I10 Essential (primary) hypertension: Secondary | ICD-10-CM | POA: Insufficient documentation

## 2018-05-09 DIAGNOSIS — Z87891 Personal history of nicotine dependence: Secondary | ICD-10-CM | POA: Insufficient documentation

## 2018-05-09 LAB — BASIC METABOLIC PANEL
ANION GAP: 9 (ref 5–15)
BUN: 10 mg/dL (ref 6–20)
CALCIUM: 9.3 mg/dL (ref 8.9–10.3)
CHLORIDE: 105 mmol/L (ref 98–111)
CO2: 26 mmol/L (ref 22–32)
CREATININE: 1.16 mg/dL (ref 0.61–1.24)
GFR calc non Af Amer: >60 mL/min (ref >60)
Glucose, Bld: 86 mg/dL (ref 70–99)
Potassium: 3.8 mmol/L (ref 3.5–5.1)
SODIUM: 140 mmol/L (ref 135–145)

## 2018-05-09 LAB — CBC
HCT: 46.8 % (ref 39.0–52.0)
HEMOGLOBIN: 15.1 g/dL (ref 13.0–17.0)
MCH: 29 pg (ref 26.0–34.0)
MCHC: 32.3 g/dL (ref 30.0–36.0)
MCV: 89.8 fL (ref 78.0–100.0)
PLATELETS: 303 10*3/uL (ref 150–400)
RBC: 5.21 MIL/uL (ref 4.22–5.81)
RDW: 12.9 % (ref 11.5–15.5)
WBC: 6.7 10*3/uL (ref 4.0–10.5)

## 2018-05-09 MED ORDER — IPRATROPIUM-ALBUTEROL 0.5-2.5 (3) MG/3ML IN SOLN
3.0000 mL | Freq: Once | RESPIRATORY_TRACT | Status: AC
Start: 2018-05-09 — End: 2018-05-09
  Administered 2018-05-09: 3 mL via RESPIRATORY_TRACT

## 2018-05-09 MED ORDER — ALBUTEROL SULFATE HFA 108 (90 BASE) MCG/ACT IN AERS: 1.0000 | INHALATION_SPRAY | Freq: Four times a day (QID) | RESPIRATORY_TRACT | 0 refills | Status: DC | PRN

## 2018-05-09 MED ORDER — IPRATROPIUM-ALBUTEROL 0.5-2.5 (3) MG/3ML IN SOLN
3.0000 mL | Freq: Once | RESPIRATORY_TRACT | Status: AC
  Administered 2018-05-09: 3 mL via RESPIRATORY_TRACT
  Filled 2018-05-09: qty 3

## 2018-05-09 MED ORDER — IPRATROPIUM-ALBUTEROL 0.5-2.5 (3) MG/3ML IN SOLN
RESPIRATORY_TRACT | Status: AC
  Filled 2018-05-09: qty 3

## 2018-05-09 MED ORDER — PREDNISONE 10 MG PO TABS: 40.0000 mg | ORAL_TABLET | Freq: Every day | ORAL | 0 refills | Status: AC

## 2018-05-09 MED ORDER — PREDNISONE 20 MG PO TABS
60.0000 mg | ORAL_TABLET | Freq: Once | ORAL | Status: AC
Start: 2018-05-09 — End: 2018-05-09
  Administered 2018-05-09: 60 mg via ORAL
  Filled 2018-05-09: qty 3

## 2018-05-09 NOTE — ED Triage Notes (Signed)
Pt c/o shortness of breath with chest tightness that started yesterday, reports productive cough that started this am. Also has a toothache.

## 2018-05-09 NOTE — ED Provider Notes (Signed)
[facility] EMERGENCY DEPARTMENT Provider Note   CSN: [id number] Arrival date & time: 05/09/18  1650     History   Chief Complaint Chief Complaint  Patient presents with  . Shortness of Breath    HPI RONDARIUS KADRMAS is a 37 y.o. male with history of asthma is here for evaluation of shortness of breath.  Onset this morning when he woke up.  Shortness of breath described as chest tightness that gets worse with breathing in, making him cough and have coughing spells.  Associated with forceful cough, dry and audible wheezing.  Feels similar to previous asthma flares.  Ran out of inhaler several weeks ago.  Symptoms are occurring at rest and with exertion as well.  No history of tobacco use.  Also notes intermittent, dental pain to the right upper side of his gums for several months.  States coughing makes his dental pain worse.  No fevers, chills, nausea, vomiting, abdominal pain, chest pain with exertion.  Seen in the ER for wheezing and dental pain 1 month ago.  States naproxen that was given to him did not help.  No dentist or insurance.   HPI  Past Medical History:  Diagnosis Date  . Asthma   . Hypertension     Patient Active Problem List   Diagnosis Date Noted  . HTN (hypertension) 09/06/2017  . Asthma in adult, mild persistent, with status asthmaticus 09/06/2017    Past Surgical History:  Procedure Laterality Date  . skull surgery          Home Medications    Prior to Admission medications   Medication Sig Start Date End Date Taking? Authorizing Provider  albuterol (PROVENTIL HFA;VENTOLIN HFA) 108 (90 Base) MCG/ACT inhaler Inhale 1-2 puffs into the lungs every 6 (six) hours as needed for wheezing or shortness of breath. 05/09/18   Liberty Handy, PA-C  amoxicillin (AMOXIL) 500 MG capsule Take 1 capsule (500 mg total) by mouth 3 (three) times daily. 03/27/18   Felicie Morn, NP  benzonatate (TESSALON) 100 MG capsule Take 1 capsule (100 mg total) by  mouth every 8 (eight) hours. 01/03/18   Janne Napoleon, NP  fluticasone (FLOVENT HFA) 44 MCG/ACT inhaler Inhale 2 puffs 2 (two) times daily into the lungs. Patient not taking: Reported on 01/03/2018 09/08/17 09/08/18  Albertine Grates, MD  guaiFENesin (MUCINEX) 600 MG 12 hr tablet Take 1 tablet (600 mg total) 2 (two) times daily by mouth. Patient not taking: Reported on 01/03/2018 09/08/17 09/08/18  Albertine Grates, MD  guaifenesin (TUSSIN CHEST CONGESTION) 100 MG/5ML syrup Take 200 mg 3 (three) times daily as needed by mouth for cough or congestion.    [provider]  naproxen (NAPROSYN) 375 MG tablet Take 1 tablet (375 mg total) by mouth 2 (two) times daily. 03/27/18   Felicie Morn, NP  predniSONE (DELTASONE) 10 MG tablet Take 4 tablets (40 mg total) by mouth daily for 5 days. 05/09/18 05/14/18  Liberty Handy, PA-C    Family History No family history on file.  Social History Social History   Tobacco Use  . Smoking status: Former Games developer  . Smokeless tobacco: Never Used  Substance Use Topics  . Alcohol use: Yes  . Drug use: Yes    Types: Marijuana     Allergies   Hydrochlorothiazide   Review of Systems Review of Systems  HENT: Positive for dental problem.   Respiratory: Positive for cough, chest tightness and wheezing.   All other systems reviewed  and are negative.    Physical Exam Updated Vital Signs BP (!) 162/108   Pulse 74   Temp 98.4 F (36.9 C) (Oral)   Resp 20   Ht 5\' 10"  (1.778 m)   Wt 83.9 kg (185 lb)   SpO2 100%   BMI 26.54 kg/m   Physical Exam  Constitutional: He is oriented to person, place, and time. He appears well-developed and well-nourished. No distress.  NAD.  HENT:  Head: Normocephalic and atraumatic.  Right Ear: External ear normal.  Left Ear: External ear normal.  Nose: Nose normal.  Mouth/Throat:    Diffuse gumline hypertrophy throughout.  Tooth #3 is missing, it appears base of the tooth is erythematous but without edema, fluctuance,  drainage.  Minimally tender locally at occlusal surface.  Moist mucous membranes.  Oropharynx is slightly erythematous.  Tonsils and uvula without edema, asymmetry, exudates.  No sublingual edema or tenderness.  Eyes: Conjunctivae and EOM are normal. No scleral icterus.  Neck: Normal range of motion. Neck supple.  Cardiovascular: Normal rate, regular rhythm, normal heart sounds and intact distal pulses.  No reproducible chest wall tenderness.  2+ DP and radial pulses bilaterally.  No lower extremity edema or calf tenderness.  Pulmonary/Chest: Effort normal. He has wheezes in the right upper field, the right middle field, the right lower field, the left upper field, the left middle field and the left lower field.  Inspiratory and expiratory wheezing in all lung fields.  Speaking in full sentences without increased work of breathing.  Musculoskeletal: Normal range of motion. He exhibits no deformity.  Neurological: He is alert and oriented to person, place, and time.  Skin: Skin is warm and dry. Capillary refill takes less than 2 seconds.  Psychiatric: He has a normal mood and affect. His behavior is normal. Judgment and thought content normal.  Nursing note and vitals reviewed.    ED Treatments / Results  Labs (all labs ordered are listed, but only abnormal results are displayed) Labs Reviewed  BASIC METABOLIC PANEL  CBC  I-STAT TROPONIN, ED    EKG EKG Interpretation  Date/Time:  Thursday May 09 2018 17:07:33 EDT Ventricular Rate:  86 PR Interval:  152 QRS Duration: 84 QT Interval:  360 QTC Calculation: 430 R Axis:   84 Text Interpretation:  Normal sinus rhythm Normal ECG Confirmed by Zadie Rhine ([id number]) on 05/10/2018 8:31:29 AM   Radiology Dg Chest 2 View  Result Date: 05/09/2018 CLINICAL DATA:  Chest pain and shortness of breath EXAM: CHEST - 2 VIEW COMPARISON:  01/03/2018 FINDINGS: The heart size and mediastinal contours are within normal limits. Both lungs are clear.  The visualized skeletal structures are unremarkable. IMPRESSION: No active cardiopulmonary disease. Electronically Signed   By: Deatra Robinson M.D.   On: 05/09/2018 17:58    Procedures Procedures (including critical care time)  Medications Ordered in ED Medications  ipratropium-albuterol (DUONEB) 0.5-2.5 (3) MG/3ML nebulizer solution 3 mL (3 mLs Nebulization Given 05/09/18 2001)  predniSONE (DELTASONE) tablet 60 mg (60 mg Oral Given 05/09/18 2001)  ipratropium-albuterol (DUONEB) 0.5-2.5 (3) MG/3ML nebulizer solution 3 mL (3 mLs Nebulization Given 05/09/18 2108)     Initial Impression / Assessment and Plan / ED Course  I have reviewed the triage vital signs and the nursing notes.  Pertinent labs & imaging results that were available during my care of the patient were reviewed by me and considered in my medical decision making (see chart for details).    Patient presents with chest tightness, wheezing.  History of asthma.  Symptoms similar to previous asthma flares.  On exam patient is nontoxic-appearing with normal work of breathing.  No fever, tachypnea, tachycardia with normal oxygen saturations.  Initial exam noted inspiratory and expiratory wheezing in all lung fields.  Wheezing improved in the ER with breathing treatments x2 and prednisone.  Triage labs/imaging reviewed and unremarkable.  Likely acute asthma exacerbation in setting of medical noncompliance.  Will treat symptoms with prednisone and albuterol.  Patient noted to be hypertensive in the ER, I compared to previous charts/ER visits and he has been greater than 150/90 in the past and this is probably his baseline.  I discussed noted elevated blood pressures.  This is in setting of 2 DuoNeb treatments.  He is asymptomatic.  Encouraged to establish follow-up with PCP for long-term control of asthma and possible early hypertension.  Discussed importance of dental care as well.  Patient has chronic dental pain that has been ongoing for  several months.  No signs of abscess today.  No signs of deep facial/throat/neck infection.  Given that there is no significant local edema, fluctuance, drainage, fevers will defer antibiotics at this time.  Dentist contact information given.  Patient is aware of symptoms/signs that would warrant return to the ER.   Final Clinical Impressions(s) / ED Diagnoses   Final diagnoses:  Mild intermittent asthma with exacerbation  Chronic dental pain    ED Discharge Orders        Ordered    albuterol (PROVENTIL HFA;VENTOLIN HFA) 108 (90 Base) MCG/ACT inhaler  Every 6 hours PRN     05/09/18 2148    predniSONE (DELTASONE) 10 MG tablet  Daily     05/09/18 2148       Liberty HandyGibbons, Cierria Height J, PA-C 05/10/18 1156    Loren RacerYelverton, David, MD 05/14/18 [phone]

## 2018-05-09 NOTE — ED Notes (Signed)
istat trop= 0.00 at 17:22

## 2018-05-09 NOTE — ED Notes (Signed)
Patient verbalizes understanding of discharge instructions. Opportunity for questioning and answers were provided. Armband removed by staff, pt discharged from ED.  

## 2018-05-09 NOTE — Discharge Instructions (Addendum)
You were seen in the ER for shortness of breath.    Breathing improved after 2 breathing treatment.   Take prednisone and use inhaler.   Return for worsening symptoms, chest pain or shortness of breath with activity or exertion, productive cough, fevers, chills.  Follow up with dentist as soon as possible. You will continue to have intermittent dental pain until broken tooth is fixed.  Return for fevers, facial or gum swelling, drainage. Take [phone] mg acetaminophen (tylenol) for pain every 8 hours.

## 2018-05-10 LAB — I-STAT TROPONIN, ED: TROPONIN I, POC: 0 ng/mL (ref 0.00–0.08)

## 2018-07-17 ENCOUNTER — Ambulatory Visit: Payer: Self-pay

## 2018-07-17 ENCOUNTER — Other Ambulatory Visit: Payer: Self-pay | Admitting: Occupational Medicine

## 2018-07-17 DIAGNOSIS — M545 Low back pain, unspecified: Secondary | ICD-10-CM

## 2020-07-21 ENCOUNTER — Other Ambulatory Visit: Payer: Self-pay

## 2020-07-21 ENCOUNTER — Encounter (HOSPITAL_COMMUNITY): Payer: Self-pay | Admitting: Emergency Medicine

## 2020-07-21 ENCOUNTER — Ambulatory Visit (INDEPENDENT_AMBULATORY_CARE_PROVIDER_SITE_OTHER): Payer: 59

## 2020-07-21 ENCOUNTER — Ambulatory Visit (HOSPITAL_COMMUNITY)
Admission: EM | Admit: 2020-07-21 | Discharge: 2020-07-21 | Disposition: A | Discharge status: Home / Self Care | Payer: 59 | Attending: Family Medicine | Admitting: Family Medicine

## 2020-07-21 DIAGNOSIS — M545 Low back pain: Secondary | ICD-10-CM | POA: Diagnosis not present

## 2020-07-21 DIAGNOSIS — M5136 Other intervertebral disc degeneration, lumbar region: Secondary | ICD-10-CM

## 2020-07-21 MED ORDER — KETOROLAC TROMETHAMINE 30 MG/ML IJ SOLN
INTRAMUSCULAR | Status: AC
  Filled 2020-07-21: qty 1

## 2020-07-21 MED ORDER — KETOROLAC TROMETHAMINE 30 MG/ML IJ SOLN
30.0000 mg | Freq: Once | INTRAMUSCULAR | Status: AC
  Administered 2020-07-21: 30 mg via INTRAMUSCULAR

## 2020-07-21 MED ORDER — PREDNISONE 10 MG (21) PO TBPK: ORAL_TABLET | ORAL | 0 refills | Status: DC

## 2020-07-21 MED ORDER — CYCLOBENZAPRINE HCL 5 MG PO TABS: 5.0000 mg | ORAL_TABLET | Freq: Three times a day (TID) | ORAL | 0 refills | Status: DC | PRN

## 2020-07-21 NOTE — Discharge Instructions (Signed)
Your x ray showed degenerative disc disease This is most likely causing the sciatic nerve pain.  Treating with prednisone taper over the next 6 days. Take with food.  Flexeril for muscle relaxant as needed.  Follow up as needed for continued or worsening symptoms

## 2020-07-21 NOTE — ED Triage Notes (Signed)
Pt presents with lower back pain. States has hx of back pain due to injury at work. C/o of burning sensation on right side.

## 2020-07-21 NOTE — ED Provider Notes (Signed)
[facility]    CSN: [id number] Arrival date & time: 07/21/20  0844      History   Chief Complaint Chief Complaint  Patient presents with  . Back Pain    HPI William [name] is a 39 y.o. male.   Patient is a 39 year old male presents with with lower back pain.  This is a chronic issue that is been ongoing for a while for him.  History of bulging disks.  This episode has been for approximately 2 to 3 weeks.  Describes pain as dull ache in lower lumbar spine with radiation down the right leg and some associated numbness and tingling in the right leg at times.  Sometimes the pain radiates into groin area.  No saddle paresthesias, loss of bowel or bladder function.  Takes Aleve and ibuprofen as needed with minimal relief.      Past Medical History:  Diagnosis Date  . Asthma   . Hypertension     Patient Active Problem List   Diagnosis Date Noted  . HTN (hypertension) 09/06/2017  . Asthma in adult, mild persistent, with status asthmaticus 09/06/2017    Past Surgical History:  Procedure Laterality Date  . skull surgery         Home Medications    Prior to Admission medications   Medication Sig Start Date End Date Taking? Authorizing Provider  cyclobenzaprine (FLEXERIL) 5 MG tablet Take 1 tablet (5 mg total) by mouth 3 (three) times daily as needed for muscle spasms. 07/21/20   Dahlia Byes A, NP  predniSONE (STERAPRED UNI-PAK 21 TAB) 10 MG (21) TBPK tablet 6 tabs for 1 day, then 5 tabs for 1 das, then 4 tabs for 1 day, then 3 tabs for 1 day, 2 tabs for 1 day, then 1 tab for 1 day 07/21/20   Dahlia Byes A, NP  albuterol (PROVENTIL HFA;VENTOLIN HFA) 108 (90 Base) MCG/ACT inhaler Inhale 1-2 puffs into the lungs every 6 (six) hours as needed for wheezing or shortness of breath. 05/09/18 07/21/20  Liberty Handy, PA-C  fluticasone (FLOVENT HFA) 44 MCG/ACT inhaler Inhale 2 puffs 2 (two) times daily into the lungs. Patient not taking: Reported on 01/03/2018 09/08/17  07/21/20  Albertine Grates, MD    Family History History reviewed. No pertinent family history.  Social History Social History   Tobacco Use  . Smoking status: Former Games developer  . Smokeless tobacco: Never Used  Substance Use Topics  . Alcohol use: Yes  . Drug use: Yes    Types: Marijuana     Allergies   Hydrochlorothiazide   Review of Systems Review of Systems   Physical Exam Triage Vital Signs ED Triage Vitals  Enc Vitals Group     BP 07/21/20 0949 (!) 166/115     Pulse Rate 07/21/20 0949 63     Resp 07/21/20 0949 17     Temp 07/21/20 0949 98.6 F (37 C)     Temp Source 07/21/20 0949 Oral     SpO2 07/21/20 0949 98 %     Weight --      Height --      Head Circumference --      Peak Flow --      Pain Score 07/21/20 0948 10     Pain Loc --      Pain Edu? --      Excl. in GC? --    No data found.  Updated Vital Signs BP (!) 166/115 (BP Location: Right Arm)  Pulse 63   Temp 98.6 F (37 C) (Oral)   Resp 17   SpO2 98%   Visual Acuity Right Eye Distance:   Left Eye Distance:   Bilateral Distance:    Right Eye Near:   Left Eye Near:    Bilateral Near:     Physical Exam Vitals and nursing note reviewed.  Constitutional:      Appearance: Normal appearance.  HENT:     Head: Normocephalic and atraumatic.     Nose: Nose normal.  Eyes:     Conjunctiva/sclera: Conjunctivae normal.  Pulmonary:     Effort: Pulmonary effort is normal.  Musculoskeletal:     Cervical back: Normal range of motion.     Lumbar back: Positive right straight leg raise test.       Back:     Comments: Area where he describes pain No specific pain with palpation.    Skin:    General: Skin is warm and dry.  Neurological:     Mental Status: He is alert.  Psychiatric:        Mood and Affect: Mood normal.      UC Treatments / Results  Labs (all labs ordered are listed, but only abnormal results are displayed) Labs Reviewed - No data to display  EKG   Radiology DG Lumbar  Spine Complete  Result Date: 07/21/2020 CLINICAL DATA:  Low back pain EXAM: LUMBAR SPINE - COMPLETE 4+ VIEW COMPARISON:  07/17/2018 FINDINGS: Normal alignment. No fracture. Disc spaces maintained. Mild degenerative facet disease at L4-5 and L5-S1. SI joints symmetric and unremarkable. IMPRESSION: Early/mild degenerative facet disease in the lower lumbar spine. No acute bony abnormality. Electronically Signed   By: Charlett Nose M.D.   On: 07/21/2020 11:19    Procedures Procedures (including critical care time)  Medications Ordered in UC Medications  ketorolac (TORADOL) 30 MG/ML injection 30 mg (30 mg Intramuscular Given 07/21/20 1026)    Initial Impression / Assessment and Plan / UC Course  I have reviewed the triage vital signs and the nursing notes.  Pertinent labs & imaging results that were available during my care of the patient were reviewed by me and considered in my medical decision making (see chart for details).     Degenerative disc disease with sciatic nerve inflammation X ray with lumber degenerative disc disease.  No acute findings.  Treating with prednisone taper and low dose muscle relaxant as needed.  Pt has appointment to see primary care next week.  Will have him see them about elevated blood pressure.    Final Clinical Impressions(s) / UC Diagnoses   Final diagnoses:  Degenerative disc disease, lumbar     Discharge Instructions     Your x ray showed degenerative disc disease This is most likely causing the sciatic nerve pain.  Treating with prednisone taper over the next 6 days. Take with food.  Flexeril for muscle relaxant as needed.  Follow up as needed for continued or worsening symptoms     ED Prescriptions    Medication Sig Dispense Auth. Provider   predniSONE (STERAPRED UNI-PAK 21 TAB) 10 MG (21) TBPK tablet 6 tabs for 1 day, then 5 tabs for 1 das, then 4 tabs for 1 day, then 3 tabs for 1 day, 2 tabs for 1 day, then 1 tab for 1 day 21 tablet  Zhana Jeangilles A, NP   cyclobenzaprine (FLEXERIL) 5 MG tablet Take 1 tablet (5 mg total) by mouth 3 (three) times daily as needed for  muscle spasms. 30 tablet Dahlia Byes A, NP     PDMP not reviewed this encounter.   Janace Aris, NP 07/21/20 1154

## 2020-08-24 ENCOUNTER — Ambulatory Visit
Admission: RE | Admit: 2020-08-24 | Discharge: 2020-08-24 | Disposition: A | Discharge status: Home / Self Care | Payer: 59 | Source: Ambulatory Visit | Attending: Internal Medicine | Admitting: Internal Medicine

## 2020-08-24 ENCOUNTER — Other Ambulatory Visit: Payer: Self-pay | Admitting: Internal Medicine

## 2020-08-24 DIAGNOSIS — M5136 Other intervertebral disc degeneration, lumbar region: Secondary | ICD-10-CM

## 2020-11-16 ENCOUNTER — Other Ambulatory Visit: Payer: Self-pay

## 2020-11-16 ENCOUNTER — Other Ambulatory Visit: Payer: 59

## 2020-11-16 DIAGNOSIS — Z20822 Contact with and (suspected) exposure to covid-19: Secondary | ICD-10-CM

## 2020-11-17 LAB — SARS-COV-2, NAA 2 DAY TAT

## 2020-11-17 LAB — NOVEL CORONAVIRUS, NAA: SARS-CoV-2, NAA: NOT DETECTED

## 2021-02-25 IMAGING — CR DG LUMBAR SPINE COMPLETE 4+V
5 series · 5 of 5 positions shown · non-contrast
Comparison: 07/21/2020.

CLINICAL DATA: DJD.

EXAM:
LUMBAR SPINE - COMPLETE 4+ VIEW

[t l-spine a.p.]
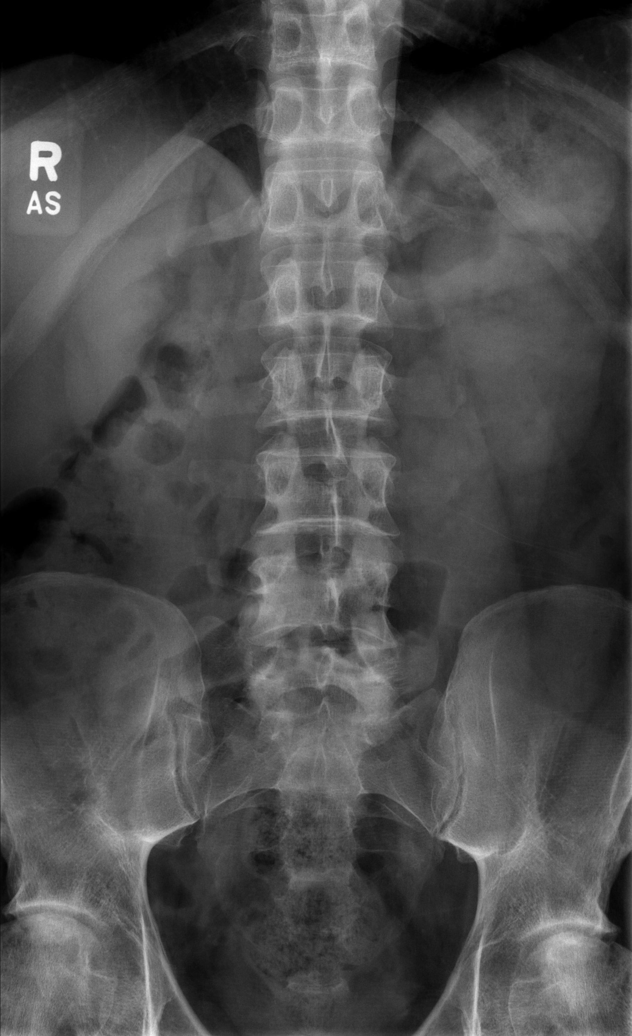

[t l-spine oblique exposure (1 of 2)]
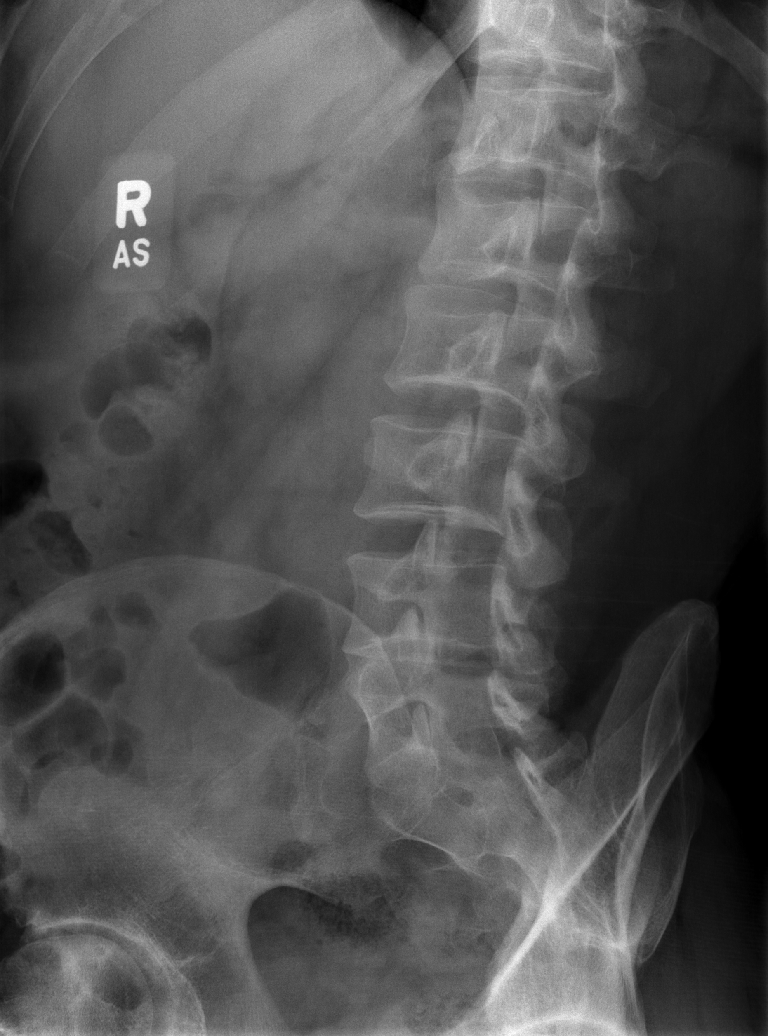

[t l-spine oblique exposure (2 of 2)]
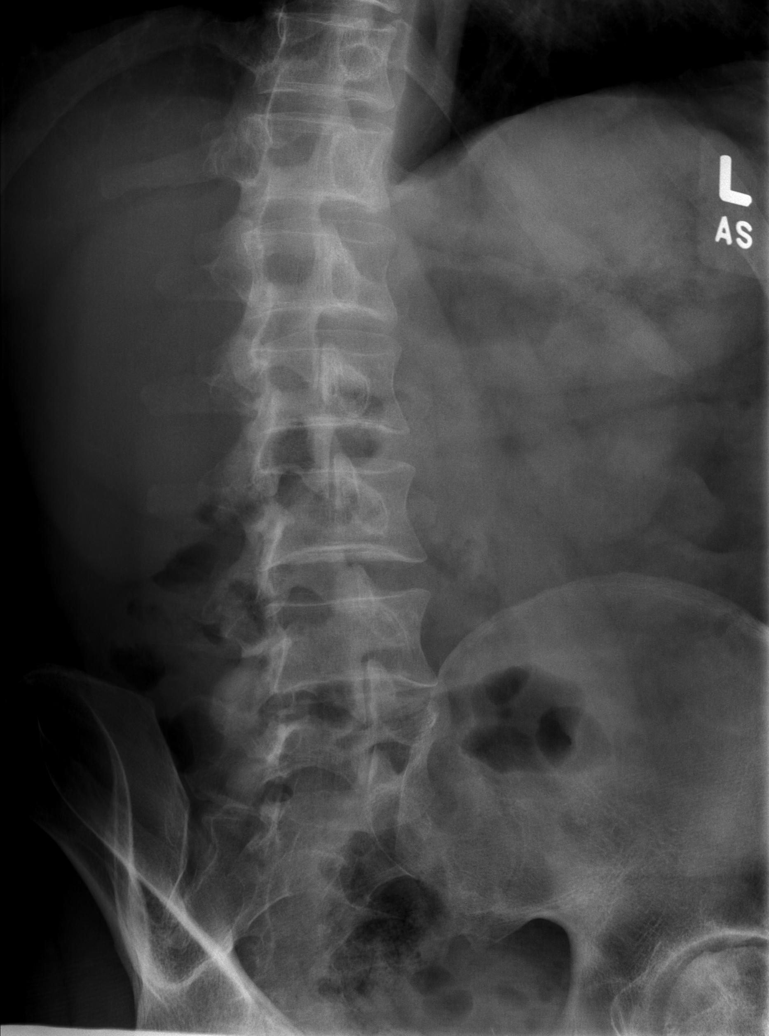

[t l-spine lat]
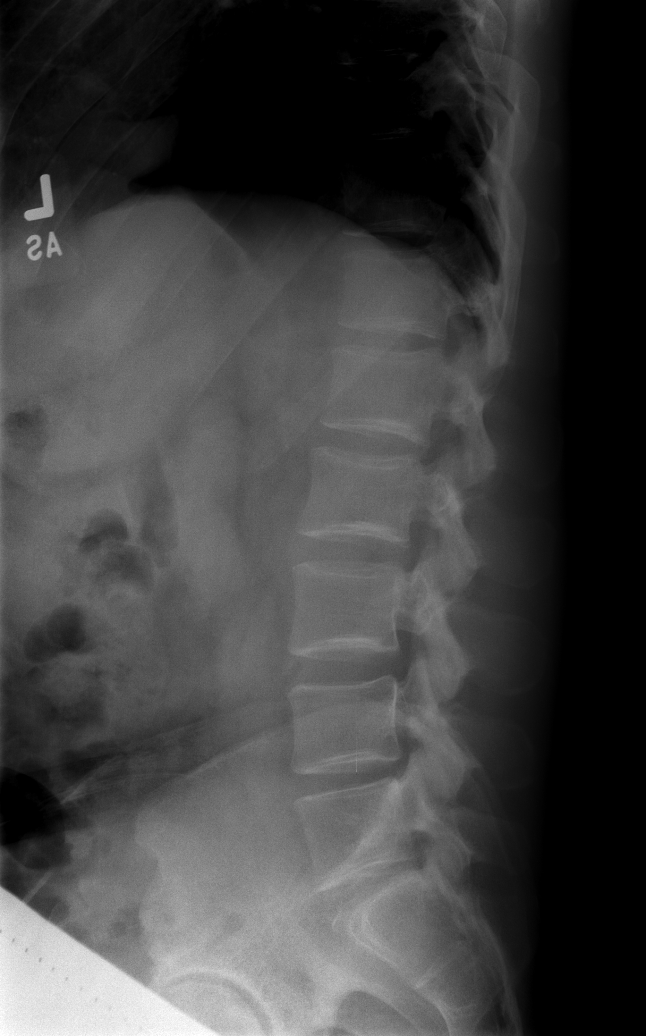

[t l-spine l5-s1 spot]
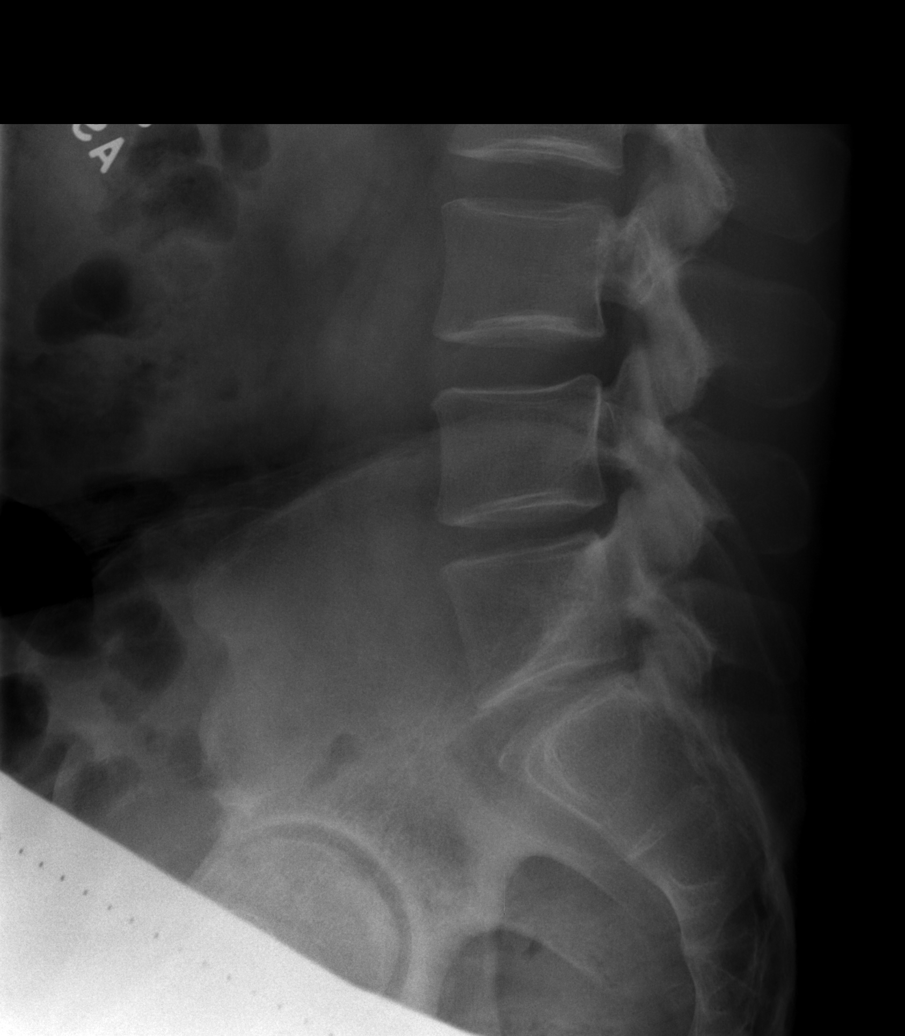

[5 of 5 positions shown; findings below may reference images not displayed]

FINDINGS: Paraspinal soft tissues are unremarkable. No acute bony abnormality
identified. Degenerative change both hips. No evidence of fracture.
Paraspinal soft tissues are unremarkable.
IMPRESSION: Degenerative change both hips. No acute abnormality identified.

## 2021-08-15 ENCOUNTER — Ambulatory Visit: Payer: Self-pay | Admitting: Student

## 2021-08-15 DIAGNOSIS — Z01818 Encounter for other preprocedural examination: Secondary | ICD-10-CM

## 2021-09-19 ENCOUNTER — Ambulatory Visit: Payer: Self-pay | Admitting: Student

## 2021-09-19 NOTE — H&P (View-Only) (Signed)
TOTAL HIP ADMISSION H&P  Patient is admitted for right total hip arthroplasty.  Subjective:  Chief Complaint: right hip pain  HPI: William [name], 40 y.o. male, has a history of pain and functional disability in the right hip(s) due to arthritis and patient has failed non-surgical conservative treatments for greater than 12 weeks to include NSAID's and/or analgesics and activity modification.  Onset of symptoms was gradual starting 2 years ago with rapidlly worsening course since that time.The patient noted no past surgery on the right hip(s).  Patient currently rates pain in the right hip at 9 out of 10 with activity. Patient has worsening of pain with activity and weight bearing, pain that interfers with activities of daily living, and pain with passive range of motion. 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 This condition presents safety issues increasing the risk of falls. There is no current active infection.  Patient Active Problem List   Diagnosis Date Noted   HTN (hypertension) 09/06/2017   Asthma in adult, mild persistent, with status asthmaticus 09/06/2017   Past Medical History:  Diagnosis Date   Asthma    Hypertension     Past Surgical History:  Procedure Laterality Date   skull surgery      Current Outpatient Medications  Medication Sig Dispense Refill Last Dose   cyclobenzaprine (FLEXERIL) 5 MG tablet Take 1 tablet (5 mg total) by mouth 3 (three) times daily as needed for muscle spasms. 30 tablet 0    predniSONE (STERAPRED UNI-PAK 21 TAB) 10 MG (21) TBPK tablet 6 tabs for 1 day, then 5 tabs for 1 das, then 4 tabs for 1 day, then 3 tabs for 1 day, 2 tabs for 1 day, then 1 tab for 1 day 21 tablet 0    No current facility-administered medications for this visit.   Allergies  Allergen Reactions   Hydrochlorothiazide Other (See Comments)    Causes headaches    Social History    Tobacco Use   Smoking status: Former   Smokeless tobacco: Never  Substance Use Topics   Alcohol use: Yes    No family history on file.   Review of Systems  Musculoskeletal:  Positive for arthralgias.  All other systems reviewed and are negative.  Objective:  Physical Exam HENT:     Head: Normocephalic.  Eyes:     Pupils: Pupils are equal, round, and reactive to light.  Cardiovascular:     Rate and Rhythm: Normal rate.  Pulmonary:     Effort: Pulmonary effort is normal.  Abdominal:     Palpations: Abdomen is soft.  Genitourinary:    Comments: Deferred Musculoskeletal:     Cervical back: Normal range of motion.     Comments: Painful ROM bilateral hips  Skin:    General: Skin is warm.  Neurological:     Mental Status: He is alert and oriented to person, place, and time.  Psychiatric:        Behavior: Behavior normal.    Vital signs in last 24 hours: @VSRANGES @  Labs:   Estimated body mass index is 26.54 kg/m as calculated from the following:   Height as of 05/09/18: 5\' 10"  (1.778 m).   Weight as of 05/09/18: 83.9 kg.   Imaging Review Plain radiographs demonstrate severe degenerative joint disease of the right hip(s). The bone quality appears to be adequate for age and reported activity level.      Assessment/Plan:  End stage arthritis, right hip(s)  The patient history, physical examination, clinical judgement of the provider and imaging studies are consistent with end stage degenerative joint disease of the right hip(s) and total hip arthroplasty is deemed medically necessary. The treatment options including medical management, injection therapy, arthroscopy and arthroplasty were discussed at length. The risks and benefits of total hip arthroplasty were presented and reviewed. The risks due to aseptic loosening, infection, stiffness, dislocation/subluxation,  thromboembolic complications and other imponderables were discussed.  The patient acknowledged the  explanation, agreed to proceed with the plan and consent was signed. Patient is being admitted for inpatient treatment for surgery, pain control, PT, OT, prophylactic antibiotics, VTE prophylaxis, progressive ambulation and ADL's and discharge planning.The patient is planning to be discharged  home same day

## 2021-09-19 NOTE — H&P (Signed)
TOTAL HIP ADMISSION H&P  Patient is admitted for right total hip arthroplasty.  Subjective:  Chief Complaint: right hip pain  HPI: William [name], 40 y.o. male, has a history of pain and functional disability in the right hip(s) due to arthritis and patient has failed non-surgical conservative treatments for greater than 12 weeks to include NSAID's and/or analgesics and activity modification.  Onset of symptoms was gradual starting 2 years ago with rapidlly worsening course since that time.The patient noted no past surgery on the right hip(s).  Patient currently rates pain in the right hip at 9 out of 10 with activity. Patient has worsening of pain with activity and weight bearing, pain that interfers with activities of daily living, and pain with passive range of motion. 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 This condition presents safety issues increasing the risk of falls. There is no current active infection.  Patient Active Problem List   Diagnosis Date Noted   HTN (hypertension) 09/06/2017   Asthma in adult, mild persistent, with status asthmaticus 09/06/2017   Past Medical History:  Diagnosis Date   Asthma    Hypertension     Past Surgical History:  Procedure Laterality Date   skull surgery      Current Outpatient Medications  Medication Sig Dispense Refill Last Dose   cyclobenzaprine (FLEXERIL) 5 MG tablet Take 1 tablet (5 mg total) by mouth 3 (three) times daily as needed for muscle spasms. 30 tablet 0    predniSONE (STERAPRED UNI-PAK 21 TAB) 10 MG (21) TBPK tablet 6 tabs for 1 day, then 5 tabs for 1 das, then 4 tabs for 1 day, then 3 tabs for 1 day, 2 tabs for 1 day, then 1 tab for 1 day 21 tablet 0    No current facility-administered medications for this visit.   Allergies  Allergen Reactions   Hydrochlorothiazide Other (See Comments)    Causes headaches    Social History    Tobacco Use   Smoking status: Former   Smokeless tobacco: Never  Substance Use Topics   Alcohol use: Yes    No family history on file.   Review of Systems  Musculoskeletal:  Positive for arthralgias.  All other systems reviewed and are negative.  Objective:  Physical Exam HENT:     Head: Normocephalic.  Eyes:     Pupils: Pupils are equal, round, and reactive to light.  Cardiovascular:     Rate and Rhythm: Normal rate.  Pulmonary:     Effort: Pulmonary effort is normal.  Abdominal:     Palpations: Abdomen is soft.  Genitourinary:    Comments: Deferred Musculoskeletal:     Cervical back: Normal range of motion.     Comments: Painful ROM bilateral hips  Skin:    General: Skin is warm.  Neurological:     Mental Status: He is alert and oriented to person, place, and time.  Psychiatric:        Behavior: Behavior normal.    Vital signs in last 24 hours: @VSRANGES @  Labs:   Estimated body mass index is 26.54 kg/m as calculated from the following:   Height as of 05/09/18: 5\' 10"  (1.778 m).   Weight as of 05/09/18: 83.9 kg.   Imaging Review Plain radiographs demonstrate severe degenerative joint disease of the right hip(s). The bone quality appears to be adequate for age and reported activity level.      Assessment/Plan:  End stage arthritis, right hip(s) ° °The patient history, physical examination, clinical judgement of the provider and imaging studies are consistent with end stage degenerative joint disease of the right hip(s) and total hip arthroplasty is deemed medically necessary. The treatment options including medical management, injection therapy, arthroscopy and arthroplasty were discussed at length. The risks and benefits of total hip arthroplasty were presented and reviewed. The risks due to aseptic loosening, infection, stiffness, dislocation/subluxation,  thromboembolic complications and other imponderables were discussed.  The patient acknowledged the  explanation, agreed to proceed with the plan and consent was signed. Patient is being admitted for inpatient treatment for surgery, pain control, PT, OT, prophylactic antibiotics, VTE prophylaxis, progressive ambulation and ADL's and discharge planning.The patient is planning to be discharged  home same day ° ° ° °

## 2021-09-19 NOTE — Progress Notes (Addendum)
Anesthesia Review:  PCP: Dr Lorenda Ishihara  with [facility] on [address]  cLEARANC3E ON CHART DATED 05/10/21.   Cardiologist : none  Chest x-ray : EKG :09/23/21  Echo : Stress test: Cardiac Cath :  Activity level: can do a flight of stairs without difficulty  Sleep Study/ CPAP : none  Fasting Blood Sugar :      / Checks Blood Sugar -- times a day:   Blood Thinner/ Instructions /Last Dose: ASA / Instructions/ Last Dose :   No covid test- ambulatory surgery

## 2021-09-20 NOTE — Progress Notes (Signed)
Your procedure is scheduled on:     10/06/2021   Report to [facility] Main  Entrance   Report to admitting at   [phone]     Call this number if you have problems the morning of surgery [phone]    REMEMBER: NO  SOLID FOOD CANDY OR GUM AFTER MIDNIGHT. CLEAR LIQUIDS UNTIL   0430am        . NOTHING BY MOUTH EXCEPT CLEAR LIQUIDS UNTIL    0430am   . PLEASE FINISH ENSURE DRINK PER SURGEON ORDER  WHICH NEEDS TO BE COMPLETED AT   0430am    .      CLEAR LIQUID DIET   Foods Allowed                                                                    Coffee and tea, regular and decaf                            Fruit ices (not with fruit pulp)                                      Iced Popsicles                                    Carbonated beverages, regular and diet                                    Cranberry, grape and apple juices Sports drinks like Gatorade Lightly seasoned clear broth or consume(fat free) Sugar, honey syrup ___________________________________________________________________      BRUSH YOUR TEETH MORNING OF SURGERY AND RINSE YOUR MOUTH OUT, NO CHEWING GUM CANDY OR MINTS.     Take these medicines the morning of surgery with A SIP OF WATER:      DO NOT TAKE ANY DIABETIC MEDICATIONS DAY OF YOUR SURGERY                               You may not have any metal on your body including hair pins and              piercings  Do not wear jewelry, make-up, lotions, powders or perfumes, deodorant             Do not wear nail polish on your fingernails.  Do not shave  48 hours prior to surgery.              Men may shave face and neck.   Do not bring valuables to the hospital. [facility] IS NOT             RESPONSIBLE   FOR VALUABLES.  Contacts, dentures or bridgework may not be worn into surgery.  Leave suitcase in the car. After surgery it may be brought to your room.     Patients discharged the day of surgery will  not be allowed to drive  home. IF YOU ARE HAVING SURGERY AND GOING HOME THE SAME DAY, YOU MUST HAVE AN ADULT TO DRIVE YOU HOME AND BE WITH YOU FOR 24 HOURS. YOU MAY GO HOME BY TAXI OR UBER OR ORTHERWISE, BUT AN ADULT MUST ACCOMPANY YOU HOME AND STAY WITH YOU FOR 24 HOURS.  Name and phone number of your driver:  Special Instructions: N/A              Please read over the following fact sheets you were given: _____________________________________________________________________  [facility] - Preparing for Surgery Before surgery, you can play an important role.  Because skin is not sterile, your skin needs to be as free of germs as possible.  You can reduce the number of germs on your skin by washing with CHG (chlorahexidine gluconate) soap before surgery.  CHG is an antiseptic cleaner which kills germs and bonds with the skin to continue killing germs even after washing. Please DO NOT use if you have an allergy to CHG or antibacterial soaps.  If your skin becomes reddened/irritated stop using the CHG and inform your nurse when you arrive at Short Stay. Do not shave (including legs and underarms) for at least 48 hours prior to the first CHG shower.  You may shave your face/neck. Please follow these instructions carefully:  1.  Shower with CHG Soap the night before surgery and the  morning of Surgery.  2.  If you choose to wash your hair, wash your hair first as usual with your  normal  shampoo.  3.  After you shampoo, rinse your hair and body thoroughly to remove the  shampoo.                           4.  Use CHG as you would any other liquid soap.  You can apply chg directly  to the skin and wash                       Gently with a scrungie or clean washcloth.  5.  Apply the CHG Soap to your body ONLY FROM THE NECK DOWN.   Do not use on face/ open                           Wound or open sores. Avoid contact with eyes, ears mouth and genitals (private parts).                       Wash face,  Genitals (private parts) with  your normal soap.             6.  Wash thoroughly, paying special attention to the area where your surgery  will be performed.  7.  Thoroughly rinse your body with warm water from the neck down.  8.  DO NOT shower/wash with your normal soap after using and rinsing off  the CHG Soap.                9.  Pat yourself dry with a clean towel.            10.  Wear clean pajamas.            11.  Place clean sheets on your bed the night of your first shower and do not  sleep with pets. Day of Surgery : Do not  apply any lotions/deodorants the morning of surgery.  Please wear clean clothes to the hospital/surgery center.  FAILURE TO FOLLOW THESE INSTRUCTIONS MAY RESULT IN THE CANCELLATION OF YOUR SURGERY PATIENT SIGNATURE_________________________________  NURSE SIGNATURE__________________________________  ________________________________________________________________________

## 2021-09-23 ENCOUNTER — Other Ambulatory Visit: Payer: Self-pay

## 2021-09-23 ENCOUNTER — Encounter (HOSPITAL_COMMUNITY): Payer: Self-pay

## 2021-09-23 ENCOUNTER — Encounter (HOSPITAL_COMMUNITY)
Admission: RE | Admit: 2021-09-23 | Discharge: 2021-09-23 | Disposition: A | Discharge status: Home / Self Care | Payer: 59 | Source: Ambulatory Visit | Attending: Orthopedic Surgery | Admitting: Orthopedic Surgery

## 2021-09-23 VITALS — BP 147/94 | HR 59 | Temp 98.4°F | Resp 16 | Ht 69.75 in | Wt 176.0 lb

## 2021-09-23 DIAGNOSIS — Z01818 Encounter for other preprocedural examination: Secondary | ICD-10-CM

## 2021-09-23 HISTORY — DX: Unspecified osteoarthritis, unspecified site: M19.90

## 2021-09-23 LAB — CBC
HCT: 45 % (ref 39.0–52.0)
Hemoglobin: 14.7 g/dL (ref 13.0–17.0)
MCH: 29.2 pg (ref 26.0–34.0)
MCHC: 32.7 g/dL (ref 30.0–36.0)
MCV: 89.5 fL (ref 80.0–100.0)
Platelets: 318 10*3/uL (ref 150–400)
RBC: 5.03 MIL/uL (ref 4.22–5.81)
RDW: 13.1 % (ref 11.5–15.5)
WBC: 5 10*3/uL (ref 4.0–10.5)
nRBC: 0 % (ref 0.0–0.2)

## 2021-09-23 LAB — COMPREHENSIVE METABOLIC PANEL
ALT: 28 U/L (ref 0–44)
AST: 19 U/L (ref 15–41)
Albumin: 4.5 g/dL (ref 3.5–5.0)
Alkaline Phosphatase: 53 U/L (ref 38–126)
Anion gap: 8 (ref 5–15)
BUN: 14 mg/dL (ref 6–20)
CO2: 26 mmol/L (ref 22–32)
Calcium: 9.1 mg/dL (ref 8.9–10.3)
Chloride: 102 mmol/L (ref 98–111)
Creatinine, Ser: 0.9 mg/dL (ref 0.61–1.24)
GFR, Estimated: >60 mL/min (ref >60)
Glucose, Bld: 99 mg/dL (ref 70–99)
Potassium: 3.9 mmol/L (ref 3.5–5.1)
Sodium: 136 mmol/L (ref 135–145)
Total Bilirubin: 1.1 mg/dL (ref 0.3–1.2)
Total Protein: 7.7 g/dL (ref 6.5–8.1)

## 2021-09-23 LAB — SURGICAL PCR SCREEN
MRSA, PCR: NEGATIVE
Staphylococcus aureus: NEGATIVE

## 2021-09-23 LAB — PROTIME-INR
INR: 1 (ref 0.8–1.2)
Prothrombin Time: 13.2 seconds (ref 11.4–15.2)

## 2021-10-18 NOTE — Anesthesia Preprocedure Evaluation (Addendum)
Anesthesia Evaluation  Patient identified by MRN, date of birth, ID band Patient awake    Reviewed: Allergy & Precautions, NPO status , Patient's Chart, lab work & pertinent test results  Airway Mallampati: II  TM Distance: >3 FB Neck ROM: Full    Dental no notable dental hx.    Pulmonary asthma , Patient abstained from smoking.,    Pulmonary exam normal breath sounds clear to auscultation       Cardiovascular hypertension, Pt. on medications Normal cardiovascular exam Rhythm:Regular Rate:Normal  ECG: SB, rate 59   Neuro/Psych negative neurological ROS  negative psych ROS   GI/Hepatic negative GI ROS, Neg liver ROS,   Endo/Other  negative endocrine ROS  Renal/GU negative Renal ROS     Musculoskeletal  (+) Arthritis ,   Abdominal   Peds  Hematology negative hematology ROS (+)   Anesthesia Other Findings Right hip avascular necrosis  Reproductive/Obstetrics                            Anesthesia Physical Anesthesia Plan  ASA: 2  Anesthesia Plan: Spinal   Post-op Pain Management:    Induction: Intravenous  PONV Risk Score and Plan: 1 and Ondansetron, Dexamethasone, Propofol infusion, Midazolam and Treatment may vary due to age or medical condition  Airway Management Planned: Simple Face Mask  Additional Equipment:   Intra-op Plan:   Post-operative Plan:   Informed Consent: I have reviewed the patients History and Physical, chart, labs and discussed the procedure including the risks, benefits and alternatives for the proposed anesthesia with the patient or authorized representative who has indicated his/her understanding and acceptance.     Dental advisory given  Plan Discussed with: CRNA  Anesthesia Plan Comments:         Anesthesia Quick Evaluation

## 2021-10-19 ENCOUNTER — Ambulatory Visit (HOSPITAL_COMMUNITY)
Admission: RE | Admit: 2021-10-19 | Discharge: 2021-10-19 | Disposition: A | Discharge status: Home / Self Care | Payer: 59 | Attending: Orthopedic Surgery | Admitting: Orthopedic Surgery

## 2021-10-19 ENCOUNTER — Ambulatory Visit (HOSPITAL_COMMUNITY): Payer: 59

## 2021-10-19 ENCOUNTER — Encounter (HOSPITAL_COMMUNITY): Payer: Self-pay | Admitting: Orthopedic Surgery

## 2021-10-19 ENCOUNTER — Ambulatory Visit (HOSPITAL_COMMUNITY): Payer: 59 | Admitting: Physician Assistant

## 2021-10-19 ENCOUNTER — Encounter (HOSPITAL_COMMUNITY)
Admission: RE | Disposition: A | Discharge status: Home / Self Care | Payer: Self-pay | Source: Home / Self Care | Attending: Orthopedic Surgery

## 2021-10-19 ENCOUNTER — Ambulatory Visit (HOSPITAL_COMMUNITY): Payer: 59 | Admitting: Anesthesiology

## 2021-10-19 DIAGNOSIS — J45909 Unspecified asthma, uncomplicated: Secondary | ICD-10-CM | POA: Insufficient documentation

## 2021-10-19 DIAGNOSIS — M879 Osteonecrosis, unspecified: Secondary | ICD-10-CM | POA: Diagnosis not present

## 2021-10-19 DIAGNOSIS — M25751 Osteophyte, right hip: Secondary | ICD-10-CM | POA: Diagnosis not present

## 2021-10-19 DIAGNOSIS — Z87891 Personal history of nicotine dependence: Secondary | ICD-10-CM | POA: Diagnosis not present

## 2021-10-19 DIAGNOSIS — M1611 Unilateral primary osteoarthritis, right hip: Secondary | ICD-10-CM | POA: Diagnosis not present

## 2021-10-19 DIAGNOSIS — M87051 Idiopathic aseptic necrosis of right femur: Secondary | ICD-10-CM | POA: Diagnosis present

## 2021-10-19 DIAGNOSIS — I10 Essential (primary) hypertension: Secondary | ICD-10-CM | POA: Diagnosis not present

## 2021-10-19 DIAGNOSIS — Z09 Encounter for follow-up examination after completed treatment for conditions other than malignant neoplasm: Secondary | ICD-10-CM

## 2021-10-19 DIAGNOSIS — Z01818 Encounter for other preprocedural examination: Secondary | ICD-10-CM

## 2021-10-19 DIAGNOSIS — Z419 Encounter for procedure for purposes other than remedying health state, unspecified: Secondary | ICD-10-CM

## 2021-10-19 HISTORY — PX: TOTAL HIP ARTHROPLASTY: SHX124

## 2021-10-19 LAB — TYPE AND SCREEN
ABO/RH(D): O POS
Antibody Screen: NEGATIVE

## 2021-10-19 LAB — ABO/RH: ABO/RH(D): O POS

## 2021-10-19 SURGERY — ARTHROPLASTY, HIP, TOTAL, ANTERIOR APPROACH
Anesthesia: Spinal | Site: Hip | Laterality: Right

## 2021-10-19 MED ORDER — LACTATED RINGERS IV BOLUS: 250.0000 mL | Freq: Once | INTRAVENOUS | Status: DC

## 2021-10-19 MED ORDER — POVIDONE-IODINE 10 % EX SWAB: 2.0000 "application " | Freq: Once | CUTANEOUS | Status: DC

## 2021-10-19 MED ORDER — PROMETHAZINE HCL 25 MG/ML IJ SOLN: 6.2500 mg | INTRAMUSCULAR | Status: DC | PRN

## 2021-10-19 MED ORDER — PROPOFOL 500 MG/50ML IV EMUL
INTRAVENOUS | Status: AC
  Filled 2021-10-19: qty 50

## 2021-10-19 MED ORDER — OXYCODONE-ACETAMINOPHEN 5-325 MG PO TABS
ORAL_TABLET | ORAL | Status: AC
  Filled 2021-10-19: qty 2

## 2021-10-19 MED ORDER — OXYCODONE HCL 5 MG/5ML PO SOLN: 5.0000 mg | Freq: Once | ORAL | Status: DC | PRN

## 2021-10-19 MED ORDER — AMISULPRIDE (ANTIEMETIC) 5 MG/2ML IV SOLN: 10.0000 mg | Freq: Once | INTRAVENOUS | Status: DC | PRN

## 2021-10-19 MED ORDER — CEFAZOLIN SODIUM-DEXTROSE 2-4 GM/100ML-% IV SOLN: 2.0000 g | Freq: Four times a day (QID) | INTRAVENOUS | Status: DC

## 2021-10-19 MED ORDER — BUPIVACAINE-EPINEPHRINE (PF) 0.25% -1:200000 IJ SOLN
INTRAMUSCULAR | Status: AC
  Filled 2021-10-19: qty 30

## 2021-10-19 MED ORDER — LACTATED RINGERS IV BOLUS: 500.0000 mL | Freq: Once | INTRAVENOUS | Status: DC

## 2021-10-19 MED ORDER — FENTANYL CITRATE (PF) 100 MCG/2ML IJ SOLN
INTRAMUSCULAR | Status: DC | PRN
  Administered 2021-10-19: 100 ug via INTRAVENOUS

## 2021-10-19 MED ORDER — METHOCARBAMOL 500 MG PO TABS: 500.0000 mg | ORAL_TABLET | Freq: Four times a day (QID) | ORAL | Status: DC | PRN

## 2021-10-19 MED ORDER — FENTANYL CITRATE (PF) 100 MCG/2ML IJ SOLN
INTRAMUSCULAR | Status: AC
  Filled 2021-10-19: qty 2

## 2021-10-19 MED ORDER — PHENYLEPHRINE 40 MCG/ML (10ML) SYRINGE FOR IV PUSH (FOR BLOOD PRESSURE SUPPORT)
PREFILLED_SYRINGE | INTRAVENOUS | Status: AC
  Filled 2021-10-19: qty 10

## 2021-10-19 MED ORDER — LACTATED RINGERS IV SOLN: INTRAVENOUS | Status: DC

## 2021-10-19 MED ORDER — MIDAZOLAM HCL 5 MG/5ML IJ SOLN
INTRAMUSCULAR | Status: DC | PRN
Start: 2021-10-19 — End: 2021-10-19
  Administered 2021-10-19: 2 mg via INTRAVENOUS

## 2021-10-19 MED ORDER — ONDANSETRON HCL 4 MG/2ML IJ SOLN
INTRAMUSCULAR | Status: AC
  Filled 2021-10-19: qty 2

## 2021-10-19 MED ORDER — HYDROCODONE-ACETAMINOPHEN 5-325 MG PO TABS: 1.0000 | ORAL_TABLET | ORAL | 0 refills | Status: AC | PRN

## 2021-10-19 MED ORDER — PROPOFOL 10 MG/ML IV BOLUS
INTRAVENOUS | Status: AC
  Filled 2021-10-19: qty 20

## 2021-10-19 MED ORDER — DEXAMETHASONE SODIUM PHOSPHATE 10 MG/ML IJ SOLN
INTRAMUSCULAR | Status: AC
  Filled 2021-10-19: qty 1

## 2021-10-19 MED ORDER — SODIUM CHLORIDE 0.9 % IR SOLN
Status: DC | PRN
  Administered 2021-10-19: 3000 mL

## 2021-10-19 MED ORDER — MORPHINE SULFATE (PF) 2 MG/ML IV SOLN
INTRAVENOUS | Status: AC
  Filled 2021-10-19: qty 1

## 2021-10-19 MED ORDER — BUPIVACAINE-EPINEPHRINE 0.25% -1:200000 IJ SOLN
INTRAMUSCULAR | Status: DC | PRN
  Administered 2021-10-19: 30 mL

## 2021-10-19 MED ORDER — PROPOFOL 500 MG/50ML IV EMUL
INTRAVENOUS | Status: DC | PRN
  Administered 2021-10-19: 75 ug/kg/min via INTRAVENOUS

## 2021-10-19 MED ORDER — ASPIRIN 81 MG PO CHEW: 81.0000 mg | CHEWABLE_TABLET | Freq: Two times a day (BID) | ORAL | 0 refills | Status: AC

## 2021-10-19 MED ORDER — PROPOFOL 10 MG/ML IV BOLUS
INTRAVENOUS | Status: DC | PRN
  Administered 2021-10-19: 20 mg via INTRAVENOUS
  Administered 2021-10-19: 30 mg via INTRAVENOUS
  Administered 2021-10-19 (×2): 20 mg via INTRAVENOUS

## 2021-10-19 MED ORDER — ONDANSETRON HCL 4 MG PO TABS: 4.0000 mg | ORAL_TABLET | Freq: Three times a day (TID) | ORAL | 0 refills | Status: AC | PRN

## 2021-10-19 MED ORDER — WATER FOR IRRIGATION, STERILE IR SOLN
Status: DC | PRN
  Administered 2021-10-19: 2000 mL

## 2021-10-19 MED ORDER — SODIUM CHLORIDE (PF) 0.9 % IJ SOLN
INTRAMUSCULAR | Status: DC | PRN
  Administered 2021-10-19: 30 mL

## 2021-10-19 MED ORDER — KETOROLAC TROMETHAMINE 30 MG/ML IJ SOLN
INTRAMUSCULAR | Status: AC
  Filled 2021-10-19: qty 1

## 2021-10-19 MED ORDER — HYDROCODONE-ACETAMINOPHEN 5-325 MG PO TABS
ORAL_TABLET | ORAL | Status: AC
  Filled 2021-10-19: qty 2

## 2021-10-19 MED ORDER — EPHEDRINE 5 MG/ML INJ
INTRAVENOUS | Status: AC
  Filled 2021-10-19: qty 5

## 2021-10-19 MED ORDER — HYDROCODONE-ACETAMINOPHEN 5-325 MG PO TABS: 1.0000 | ORAL_TABLET | ORAL | Status: DC | PRN

## 2021-10-19 MED ORDER — KETOROLAC TROMETHAMINE 30 MG/ML IJ SOLN
INTRAMUSCULAR | Status: DC | PRN
  Administered 2021-10-19: 30 mg

## 2021-10-19 MED ORDER — MORPHINE SULFATE (PF) 2 MG/ML IV SOLN
0.5000 mg | INTRAVENOUS | Status: DC | PRN
  Administered 2021-10-19: 12:00:00 0.5 mg via INTRAVENOUS

## 2021-10-19 MED ORDER — METHOCARBAMOL 500 MG IVPB - SIMPLE MED: 500.0000 mg | Freq: Four times a day (QID) | INTRAVENOUS | Status: DC | PRN

## 2021-10-19 MED ORDER — MIDAZOLAM HCL 2 MG/2ML IJ SOLN
INTRAMUSCULAR | Status: AC
  Filled 2021-10-19: qty 2

## 2021-10-19 MED ORDER — TRANEXAMIC ACID-NACL 1000-0.7 MG/100ML-% IV SOLN
1000.0000 mg | INTRAVENOUS | Status: AC
  Administered 2021-10-19: 08:00:00 1000 mg via INTRAVENOUS
  Filled 2021-10-19: qty 100

## 2021-10-19 MED ORDER — PHENYLEPHRINE 40 MCG/ML (10ML) SYRINGE FOR IV PUSH (FOR BLOOD PRESSURE SUPPORT)
PREFILLED_SYRINGE | INTRAVENOUS | Status: DC | PRN
  Administered 2021-10-19: 80 ug via INTRAVENOUS

## 2021-10-19 MED ORDER — MELOXICAM 15 MG PO TABS: 15.0000 mg | ORAL_TABLET | Freq: Every day | ORAL | 3 refills | Status: AC

## 2021-10-19 MED ORDER — SODIUM CHLORIDE 0.9 % IV SOLN: INTRAVENOUS | Status: DC

## 2021-10-19 MED ORDER — POVIDONE-IODINE 10 % EX SWAB
2.0000 "application " | Freq: Once | CUTANEOUS | Status: AC
  Administered 2021-10-19: 2 via TOPICAL

## 2021-10-19 MED ORDER — EPHEDRINE SULFATE-NACL 50-0.9 MG/10ML-% IV SOSY
PREFILLED_SYRINGE | INTRAVENOUS | Status: DC | PRN
  Administered 2021-10-19 (×5): 5 mg via INTRAVENOUS

## 2021-10-19 MED ORDER — ISOPROPYL ALCOHOL 70 % SOLN
Status: DC | PRN
  Administered 2021-10-19: 1 via TOPICAL

## 2021-10-19 MED ORDER — ACETAMINOPHEN 10 MG/ML IV SOLN
1000.0000 mg | Freq: Once | INTRAVENOUS | Status: AC
  Administered 2021-10-19: 09:00:00 1000 mg via INTRAVENOUS
  Filled 2021-10-19: qty 100

## 2021-10-19 MED ORDER — OXYCODONE HCL 5 MG PO TABS: 5.0000 mg | ORAL_TABLET | Freq: Once | ORAL | Status: DC | PRN

## 2021-10-19 MED ORDER — SENNA 8.6 MG PO TABS: 2.0000 | ORAL_TABLET | Freq: Every day | ORAL | 1 refills | Status: AC

## 2021-10-19 MED ORDER — ONDANSETRON HCL 4 MG/2ML IJ SOLN
INTRAMUSCULAR | Status: DC | PRN
  Administered 2021-10-19: 4 mg via INTRAVENOUS

## 2021-10-19 MED ORDER — CEFAZOLIN SODIUM-DEXTROSE 2-4 GM/100ML-% IV SOLN
2.0000 g | INTRAVENOUS | Status: AC
  Administered 2021-10-19: 08:00:00 2 g via INTRAVENOUS
  Filled 2021-10-19: qty 100

## 2021-10-19 MED ORDER — DOCUSATE SODIUM 100 MG PO CAPS: 100.0000 mg | ORAL_CAPSULE | Freq: Two times a day (BID) | ORAL | 1 refills | Status: AC

## 2021-10-19 MED ORDER — BUPIVACAINE IN DEXTROSE 0.75-8.25 % IT SOLN
INTRATHECAL | Status: DC | PRN
  Administered 2021-10-19: 2 mL via INTRATHECAL

## 2021-10-19 MED ORDER — DEXAMETHASONE SODIUM PHOSPHATE 10 MG/ML IJ SOLN
INTRAMUSCULAR | Status: DC | PRN
  Administered 2021-10-19: 10 mg via INTRAVENOUS

## 2021-10-19 MED ORDER — FENTANYL CITRATE PF 50 MCG/ML IJ SOSY: 25.0000 ug | PREFILLED_SYRINGE | INTRAMUSCULAR | Status: DC | PRN

## 2021-10-19 SURGICAL SUPPLY — 61 items
BAG COUNTER SPONGE SURGICOUNT (BAG) IMPLANT
BAG DECANTER FOR FLEXI CONT (MISCELLANEOUS) IMPLANT
BAG SURGICOUNT SPONGE COUNTING (BAG)
BAG ZIPLOCK 12X15 (MISCELLANEOUS) IMPLANT
CHLORAPREP W/TINT 26 (MISCELLANEOUS) ×3 IMPLANT
COVER PERINEAL POST (MISCELLANEOUS) ×3 IMPLANT
COVER SURGICAL LIGHT HANDLE (MISCELLANEOUS) ×3 IMPLANT
CUP ACET PINNACLE SECTR 60MM (Hips) IMPLANT
DECANTER SPIKE VIAL GLASS SM (MISCELLANEOUS) ×3 IMPLANT
DERMABOND ADVANCED (GAUZE/BANDAGES/DRESSINGS) ×2
DERMABOND ADVANCED .7 DNX12 (GAUZE/BANDAGES/DRESSINGS) ×2 IMPLANT
DRAPE IMP U-DRAPE 54X76 (DRAPES) ×3 IMPLANT
DRAPE SHEET LG 3/4 BI-LAMINATE (DRAPES) ×9 IMPLANT
DRAPE STERI IOBAN 125X83 (DRAPES) ×3 IMPLANT
DRAPE U-SHAPE 47X51 STRL (DRAPES) ×6 IMPLANT
DRSG AQUACEL AG ADV 3.5X10 (GAUZE/BANDAGES/DRESSINGS) ×3 IMPLANT
ELECT REM PT RETURN 15FT ADLT (MISCELLANEOUS) ×3 IMPLANT
GAUZE SPONGE 4X4 12PLY STRL (GAUZE/BANDAGES/DRESSINGS) ×3 IMPLANT
GLOVE SRG 8 PF TXTR STRL LF DI (GLOVE) ×1 IMPLANT
GLOVE SURG ENC MOIS LTX SZ8.5 (GLOVE) ×6 IMPLANT
GLOVE SURG ENC TEXT LTX SZ7.5 (GLOVE) ×6 IMPLANT
GLOVE SURG UNDER POLY LF SZ8 (GLOVE) ×3
GLOVE SURG UNDER POLY LF SZ8.5 (GLOVE) ×3 IMPLANT
GOWN SPEC L3 XXLG W/TWL (GOWN DISPOSABLE) ×3 IMPLANT
GOWN STRL REUS W/TWL XL LVL3 (GOWN DISPOSABLE) ×3 IMPLANT
HANDPIECE INTERPULSE COAX TIP (DISPOSABLE) ×3
HEAD CERAMIC 36 PLUS5 (Hips) ×2 IMPLANT
HOLDER FOLEY CATH W/STRAP (MISCELLANEOUS) ×3 IMPLANT
HOOD PEEL AWAY FLYTE STAYCOOL (MISCELLANEOUS) ×12 IMPLANT
JET LAVAGE IRRISEPT WOUND (IRRIGATION / IRRIGATOR) ×3
KIT TURNOVER KIT A (KITS) IMPLANT
LAVAGE JET IRRISEPT WOUND (IRRIGATION / IRRIGATOR) ×1 IMPLANT
LINER PINN ALTRX ACTABR 36X60 (Liner) IMPLANT
LINER PINNACLE ALTRAX ACTABULR (Liner) ×3 IMPLANT
MANIFOLD NEPTUNE II (INSTRUMENTS) ×3 IMPLANT
MARKER SKIN DUAL TIP RULER LAB (MISCELLANEOUS) ×3 IMPLANT
NDL SAFETY ECLIPSE 18X1.5 (NEEDLE) ×1 IMPLANT
NDL SPNL 18GX3.5 QUINCKE PK (NEEDLE) ×1 IMPLANT
NEEDLE HYPO 18GX1.5 SHARP (NEEDLE) ×3
NEEDLE SPNL 18GX3.5 QUINCKE PK (NEEDLE) ×3 IMPLANT
PACK ANTERIOR HIP CUSTOM (KITS) ×3 IMPLANT
PENCIL SMOKE EVACUATOR (MISCELLANEOUS) IMPLANT
PINNSECTOR W/GRIP ACE CUP 60MM (Hips) ×3 IMPLANT
SAW OSC TIP CART 19.5X105X1.3 (SAW) ×3 IMPLANT
SEALER BIPOLAR AQUA 6.0 (INSTRUMENTS) ×3 IMPLANT
SET HNDPC FAN SPRY TIP SCT (DISPOSABLE) ×1 IMPLANT
SPONGE T-LAP 18X18 ~~LOC~~+RFID (SPONGE) ×9 IMPLANT
STAPLER INSORB 30 2030 C-SECTI (MISCELLANEOUS) ×2 IMPLANT
STEM TRI LOC BPS SZ8 W GRIPTON IMPLANT
SUT MNCRL AB 3-0 PS2 18 (SUTURE) ×3 IMPLANT
SUT MON AB 2-0 CT1 36 (SUTURE) ×3 IMPLANT
SUT STRATAFIX PDO 1 14 VIOLET (SUTURE) ×3
SUT STRATFX PDO 1 14 VIOLET (SUTURE) ×1
SUT VIC AB 2-0 CT1 27 (SUTURE) ×3
SUT VIC AB 2-0 CT1 TAPERPNT 27 (SUTURE) IMPLANT
SUTURE STRATFX PDO 1 14 VIOLET (SUTURE) ×1 IMPLANT
SYR 3ML LL SCALE MARK (SYRINGE) ×3 IMPLANT
TRAY FOLEY MTR SLVR 16FR STAT (SET/KITS/TRAYS/PACK) IMPLANT
TRI LOC BPS SZ8 W GRIPTON ×3 IMPLANT
TUBE SUCTION HIGH CAP CLEAR NV (SUCTIONS) ×3 IMPLANT
WATER STERILE IRR 1000ML POUR (IV SOLUTION) ×3 IMPLANT

## 2021-10-19 NOTE — Anesthesia Postprocedure Evaluation (Signed)
Anesthesia Post Note  Patient: William [name]  Procedure(s) Performed: TOTAL HIP ARTHROPLASTY ANTERIOR APPROACH (Right: Hip)     Patient location during evaluation: PACU Anesthesia Type: Spinal Level of consciousness: awake Pain management: pain level controlled Vital Signs Assessment: post-procedure vital signs reviewed and stable Respiratory status: spontaneous breathing, respiratory function stable and patient connected to nasal cannula oxygen Cardiovascular status: blood pressure returned to baseline and stable Postop Assessment: no headache, no backache and no apparent nausea or vomiting Anesthetic complications: no   No notable events documented.  Last Vitals:  Vitals:   10/19/21 1330 10/19/21 1409  BP:  (!) 159/97  Pulse:  60  Resp:    Temp:    SpO2: 100% 100%    Last Pain:  Vitals:   10/19/21 1230  TempSrc:   PainSc: 0-No pain                 William [name] P Dyllan Hughett

## 2021-10-19 NOTE — Anesthesia Procedure Notes (Signed)
Spinal  Patient location during procedure: OR Start time: 10/19/2021 7:35 AM End time: 10/19/2021 7:40 AM Reason for block: surgical anesthesia Staffing Performed: anesthesiologist  Anesthesiologist: Leonides Grills, MD Preanesthetic Checklist Completed: patient identified, IV checked, risks and benefits discussed, surgical consent, monitors and equipment checked, pre-op evaluation and timeout performed Spinal Block Patient position: sitting Prep: DuraPrep Patient monitoring: cardiac monitor, continuous pulse ox and blood pressure Approach: midline Location: L4-5 Injection technique: single-shot Needle Needle type: Pencan  Needle gauge: 24 G Needle length: 9 cm Assessment Sensory level: T10 Events: CSF return Additional Notes Functioning IV was confirmed and monitors were applied. Sterile prep and drape, including hand hygiene and sterile gloves were used. The patient was positioned and the spine was prepped. The skin was anesthetized with lidocaine.  Free flow of clear CSF was obtained prior to injecting local anesthetic into the CSF.  The spinal needle aspirated freely following injection.  The needle was carefully withdrawn.  The patient tolerated the procedure well.

## 2021-10-19 NOTE — Evaluation (Signed)
Physical Therapy Evaluation Patient Details Name: William [name] MRN: [id number] DOB: 11-12-1980 Today's Date: 10/19/2021  History of Present Illness  s/p R DA THA. PMH: HTN  Clinical Impression  Patient evaluated by Physical Therapy with no further acute PT needs identified. All education has been completed and the patient has no further questions.  Pt doing very well today, see below for areas reviewed. Instructed in THA HEP and progression. Ready for d/c from PT standpoint with family assist as needed  See below for any follow-up Physical Therapy or equipment needs. PT is signing off. Thank you for this referral.        Recommendations for follow up therapy are one component of a multi-disciplinary discharge planning process, led by the attending physician.  Recommendations may be updated based on patient status, additional functional criteria and insurance authorization.  Follow Up Recommendations Follow physician's recommendations for discharge plan and follow up therapies    Assistance Recommended at Discharge PRN  Functional Status Assessment Patient has had a recent decline in their functional status and demonstrates the ability to make significant improvements in function in a reasonable and predictable amount of time.  Equipment Recommendations  Rolling walker (2 wheels)    Recommendations for Other Services       Precautions / Restrictions Precautions Precautions: None Restrictions Weight Bearing Restrictions: No Other Position/Activity Restrictions: WBAT      Mobility  Bed Mobility Overal bed mobility: Needs Assistance Bed Mobility: Supine to Sit;Sit to Supine     Supine to sit: Supervision Sit to supine: Supervision   General bed mobility comments: instructed in use of gait belt as leg lifter    Transfers Overall transfer level: Needs assistance Equipment used: Rolling walker (2 wheels) Transfers: Sit to/from Stand Sit to Stand: Supervision            General transfer comment: cues for hand placement    Ambulation/Gait Ambulation/Gait assistance: Supervision Gait Distance (Feet): 110 Feet Assistive device: Rolling walker (2 wheels) Gait Pattern/deviations: Step-to pattern;Step-through pattern;Decreased stance time - right       General Gait Details: cues for sequence initially, progression to step through  Stairs Stairs: Yes Stairs assistance: Min guard;Supervision Stair Management: Forwards;Two rails Number of Stairs: 3 General stair comments: cues for sequence  Wheelchair Mobility    Modified Rankin (Stroke Patients Only)       Balance                                             Pertinent Vitals/Pain Pain Assessment: 0-10 Pain Score: 4  Pain Location: right hip Pain Descriptors / Indicators: Sore Pain Intervention(s): Premedicated before session;Monitored during session;Limited activity within patient's tolerance;Repositioned    Home Living Family/patient expects to be discharged to:: Private residence Living Arrangements: Spouse/significant other Available Help at Discharge: Family Type of Home: House Home Access: Level entry       Home Layout: Able to live on main level with bedroom/bathroom;Two level Home Equipment: None      Prior Function Prior Level of Function : Independent/Modified Independent                     Hand Dominance        Extremity/Trunk Assessment   Upper Extremity Assessment Upper Extremity Assessment: Overall WFL for tasks assessed    Lower Extremity Assessment Lower Extremity Assessment: RLE deficits/detail  RLE Deficits / Details: ankle WFL, knee and hip grossly 3/5, limited by post op pain and weakness       Communication   Communication: No difficulties  Cognition Arousal/Alertness: Awake/alert Behavior During Therapy: WFL for tasks assessed/performed Overall Cognitive Status: Within Functional Limits for tasks assessed                                           General Comments      Exercises Total Joint Exercises Ankle Circles/Pumps: AROM;Both;10 reps Quad Sets: AROM;Both;10 reps Heel Slides: AROM;AAROM;Right;10 reps Hip ABduction/ADduction: AROM;5 reps;Right Long Arc Quad: AROM;15 reps;Seated;Right   Assessment/Plan    PT Assessment Patient needs continued PT services  PT Problem List         PT Treatment Interventions      PT Goals (Current goals can be found in the Care Plan section)  Acute Rehab PT Goals Patient Stated Goal: have less pain PT Goal Formulation: All assessment and education complete, DC therapy    Frequency     Barriers to discharge        Co-evaluation               AM-PAC PT "6 Clicks" Mobility  Outcome Measure Help needed turning from your back to your side while in a flat bed without using bedrails?: None Help needed moving from lying on your back to sitting on the side of a flat bed without using bedrails?: None Help needed moving to and from a bed to a chair (including a wheelchair)?: A Little Help needed standing up from a chair using your arms (e.g., wheelchair or bedside chair)?: A Little Help needed to walk in hospital room?: A Little Help needed climbing 3-5 steps with a railing? : A Little 6 Click Score: 20    End of Session Equipment Utilized During Treatment: Gait belt Activity Tolerance: Patient tolerated treatment well Patient left: in bed;with call bell/phone within reach Nurse Communication: Mobility status PT Visit Diagnosis: Other abnormalities of gait and mobility (R26.89)    Time: 1336-1400 PT Time Calculation (min) (ACUTE ONLY): 24 min   Charges:   PT Evaluation $PT Eval Low Complexity: 1 Low PT Treatments $Gait Training: 8-22 mins        Delice Bison, PT  Acute Rehab Dept ([facility]) [phone] Pager [phone]  10/19/2021   Drucilla Chalet 10/19/2021, 2:08 PM

## 2021-10-19 NOTE — Op Note (Signed)
OPERATIVE REPORT  SURGEON: Rod Can, MD   ASSISTANT: Cherlynn June, PA-C.  PREOPERATIVE DIAGNOSIS: Right hip avascular necrosis.   POSTOPERATIVE DIAGNOSIS: Right hip avascular necrosis.   PROCEDURE: Right total hip arthroplasty, anterior approach.   IMPLANTS: DePuy Tri Lock stem, size 8, hi offset. DePuy Pinnacle Cup, size 60 mm. DePuy Altrx liner, size 36 by 60 mm, neutral. DePuy Biolox ceramic head ball, size 36 + 5 mm.  ANESTHESIA:  MAC and Spinal  ESTIMATED BLOOD LOSS:-200 mL    ANTIBIOTICS: 2 g Ancef.  DRAINS: None.  COMPLICATIONS: None.   CONDITION: PACU - hemodynamically stable.   BRIEF CLINICAL NOTE: William [name] is a 40 y.o. male with a long-standing history of Right hip avascular necrosis with collapse. After failing conservative management, the patient was indicated for total hip arthroplasty. The risks, benefits, and alternatives to the procedure were explained, and the patient elected to proceed.  PROCEDURE IN DETAIL: Surgical site was marked by myself in the pre-op holding area. Once inside the operating room, spinal anesthesia was obtained, and a foley catheter was inserted. The patient was then positioned on the Hana table.  All bony prominences were well padded.  The hip was prepped and draped in the normal sterile surgical fashion.  A time-out was called verifying side and site of surgery. The patient received IV antibiotics within 60 minutes of beginning the procedure.   Bikini incision was created three finger breadths distal to the ASIS, taking care to stay lateral to the medial border of the ASIS. The direct anterior approach to the hip was performed through the Hueter interval.  Lateral femoral circumflex vessels were treated with the Auqumantys. The anterior capsule was exposed and an inverted T capsulotomy was made. The femoral neck cut was made to the level of the templated cut.  A corkscrew was placed into the head and the head was removed.  The  femoral head was found to have delaminated cartilage and eburnated bone. The head was passed to the back table and was measured. Pubofemoral ligament was released off of the calcar, taking care to stay on bone. Superior capsule was released from the greater trochanter, taking care to stay lateral to the posterior border of the femoral neck in order to preserve the short external rotators.   Acetabular exposure was achieved, and the pulvinar and labrum were excised. Sequential reaming of the acetabulum was then performed up to a size 59 mm reamer under direct visulization. A 60 mm cup was then opened and impacted into place at approximately 40 degrees of abduction and 20 degrees of anteversion. The final polyethylene liner was impacted into place and acetabular osteophytes were removed.    I then gained femoral exposure taking care to protect the abductors and greater trochanter.  This was performed using standard external rotation, extension, and adduction.  A cookie cutter was used to enter the femoral canal, and then the femoral canal finder was placed.  Sequential broaching was performed up to a size 8.  Calcar planer was used on the femoral neck remnant.  I placed a high offset neck and a trial head ball.  The hip was reduced.  Leg lengths and offset were checked fluoroscopically.  The hip was dislocated and trial components were removed.  The final implants were placed, and the hip was reduced.  Fluoroscopy was used to confirm component position and leg lengths.  At 90 degrees of external rotation and full extension, the hip was stable to an anterior directed  force.   The wound was copiously irrigated with Irrisept solution and normal saline using pule lavage.  Marcaine solution was injected into the periarticular soft tissue.  The wound was closed in layers using #1 Stratafix for the fascia, 2-0 Vicryl for the subcutaneous fat, Insorb subcuticular staples, 3-0 running Monocryl subcuticular stitch, and  Dermabond for the skin.  Once the glue was fully dried, an Aquacell Ag dressing was applied.  The patient was transported to the recovery room in stable condition.  Sponge, needle, and instrument counts were correct at the end of the case x2.  The patient tolerated the procedure well and there were no known complications.  Please note that a surgical assistant was a medical necessity for this procedure to perform it in a safe and expeditious manner. Assistant was necessary to provide appropriate retraction of vital neurovascular structures, to prevent femoral fracture, and to allow for anatomic placement of the prosthesis.

## 2021-10-19 NOTE — Discharge Instructions (Signed)
? ?Dr. Ludene Stokke ?Joint Replacement Specialist ?[facility] ?[address] ?[place], [place] [address] ?[phone] ? ? ?TOTAL HIP REPLACEMENT POSTOPERATIVE DIRECTIONS ? ? ? ?Hip Rehabilitation, Guidelines Following Surgery  ? ?WEIGHT BEARING ?Weight bearing as tolerated with assist device (walker, cane, etc) as directed, use it as long as suggested by your surgeon or therapist, typically at least 4-6 weeks. ? ?The results of a hip operation are greatly improved after range of motion and muscle strengthening exercises. Follow all safety measures which are given to protect your hip. If any of these exercises cause increased pain or swelling in your joint, decrease the amount until you are comfortable again. Then slowly increase the exercises. Call your caregiver if you have problems or questions.  ? ?HOME CARE INSTRUCTIONS  ?Most of the following instructions are designed to prevent the dislocation of your new hip.  ?Remove items at home which could result in a fall. This includes throw rugs or furniture in walking pathways.  ?Continue medications as instructed at time of discharge. ?You may have some home medications which will be placed on hold until you complete the course of blood thinner medication. ?You may start showering once you are discharged home. Do not remove your dressing. ?Do not put on socks or shoes without following the instructions of your caregivers.   ?Sit on chairs with arms. Use the chair arms to help push yourself up when arising.  ?Arrange for the use of a toilet seat elevator so you are not sitting low.  ?Walk with walker as instructed.  ?You may resume a sexual relationship in one month or when given the OK by your caregiver.  ?Use walker as long as suggested by your caregivers.  ?You may put full weight on your legs and walk as much as is comfortable. ?Avoid periods of inactivity such as sitting longer than an hour when not asleep. This helps prevent blood  clots.  ?You may return to work once you are cleared by your surgeon.  ?Do not drive a car for 6 weeks or until released by your surgeon.  ?Do not drive while taking narcotics.  ?Wear elastic stockings for two weeks following surgery during the day but you may remove then at night.  ?Make sure you keep all of your appointments after your operation with all of your doctors and caregivers. You should call the office at the above phone number and make an appointment for approximately two weeks after the date of your surgery. ?Please pick up a stool softener and laxative for home use as long as you are requiring pain medications. ?ICE to the affected hip every three hours for 30 minutes at a time and then as needed for pain and swelling. Continue to use ice on the hip for pain and swelling from surgery. You may notice swelling that will progress down to the foot and ankle.  This is normal after surgery.  Elevate the leg when you are not up walking on it.   ?It is important for you to complete the blood thinner medication as prescribed by your doctor. ?Continue to use the breathing machine which will help keep your temperature down.  It is common for your temperature to cycle up and down following surgery, especially at night when you are not up moving around and exerting yourself.  The breathing machine keeps your lungs expanded and your temperature down. ? ?RANGE OF MOTION AND STRENGTHENING EXERCISES  ?These exercises are designed to help you   keep full movement of your hip joint. Follow your caregiver's or physical therapist's instructions. Perform all exercises about fifteen times, three times per day or as directed. Exercise both hips, even if you have had only one joint replacement. These exercises can be done on a training (exercise) mat, on the floor, on a table or on a bed. Use whatever works the best and is most comfortable for you. Use music or television while you are exercising so that the exercises are a  pleasant break in your day. This will make your life better with the exercises acting as a break in routine you can look forward to.  ?Lying on your back, slowly slide your foot toward your buttocks, raising your knee up off the floor. Then slowly slide your foot back down until your leg is straight again.  ?Lying on your back spread your legs as far apart as you can without causing discomfort.  ?Lying on your side, raise your upper leg and foot straight up from the floor as far as is comfortable. Slowly lower the leg and repeat.  ?Lying on your back, tighten up the muscle in the front of your thigh (quadriceps muscles). You can do this by keeping your leg straight and trying to raise your heel off the floor. This helps strengthen the largest muscle supporting your knee.  ?Lying on your back, tighten up the muscles of your buttocks both with the legs straight and with the knee bent at a comfortable angle while keeping your heel on the floor.  ? ?SKILLED REHAB INSTRUCTIONS: ?If the patient is transferred to a skilled rehab facility following release from the hospital, a list of the current medications will be sent to the facility for the patient to continue.  When discharged from the skilled rehab facility, please have the facility set up the patient's Home Health Physical Therapy prior to being released. Also, the skilled facility will be responsible for providing the patient with their medications at time of release from the facility to include their pain medication and their blood thinner medication. If the patient is still at the rehab facility at time of the two week follow up appointment, the skilled rehab facility will also need to assist the patient in arranging follow up appointment in our office and any transportation needs. ? ?POST-OPERATIVE OPIOID TAPER INSTRUCTIONS: ?It is important to wean off of your opioid medication as soon as possible. If you do not need pain medication after your surgery it is ok  to stop day one. ?Opioids include: ?Codeine, Hydrocodone(Norco, Vicodin), Oxycodone(Percocet, oxycontin) and hydromorphone amongst others.  ?Long term and even short term use of opiods can cause: ?Increased pain response ?Dependence ?Constipation ?Depression ?Respiratory depression ?And more.  ?Withdrawal symptoms can include ?Flu like symptoms ?Nausea, vomiting ?And more ?Techniques to manage these symptoms ?Hydrate well ?Eat regular healthy meals ?Stay active ?Use relaxation techniques(deep breathing, meditating, yoga) ?Do Not substitute Alcohol to help with tapering ?If you have been on opioids for less than two weeks and do not have pain than it is ok to stop all together.  ?Plan to wean off of opioids ?This plan should start within one week post op of your joint replacement. ?Maintain the same interval or time between taking each dose and first decrease the dose.  ?Cut the total daily intake of opioids by one tablet each day ?Next start to increase the time between doses. ?The last dose that should be eliminated is the evening dose.  ? ? ?MAKE   SURE YOU:  ?Understand these instructions.  ?Will watch your condition.  ?Will get help right away if you are not doing well or get worse. ? ?Pick up stool softner and laxative for home use following surgery while on pain medications. ?Do not remove your dressing. ?The dressing is waterproof--it is OK to take showers. ?Continue to use ice for pain and swelling after surgery. ?Do not use any lotions or creams on the incision until instructed by your surgeon. ?Total Hip Protocol. ? ?

## 2021-10-19 NOTE — Interval H&P Note (Signed)
History and Physical Interval Note:  10/19/2021 7:01 AM  William [name]  has presented today for surgery, with the diagnosis of Right hip avascular necrosis.  The various methods of treatment have been discussed with the patient and family. After consideration of risks, benefits and other options for treatment, the patient has consented to  Procedure(s) with comments: TOTAL HIP ARTHROPLASTY ANTERIOR APPROACH (Right) - 150 as a surgical intervention.  The patient's history has been reviewed, patient examined, no change in status, stable for surgery.  I have reviewed the patient's chart and labs.  Questions were answered to the patient's satisfaction.     [name]

## 2021-10-19 NOTE — Transfer of Care (Signed)
Immediate Anesthesia Transfer of Care Note  Patient: William [name]  Procedure(s) Performed: TOTAL HIP ARTHROPLASTY ANTERIOR APPROACH (Right: Hip)  Patient Location: PACU  Anesthesia Type:Spinal  Level of Consciousness: awake and alert   Airway & Oxygen Therapy: Patient Spontanous Breathing and Patient connected to face mask oxygen  Post-op Assessment: Report given to RN and Post -op Vital signs reviewed and stable  Post vital signs: Reviewed and stable  Last Vitals:  Vitals Value Taken Time  BP 125/77 10/19/21 0948  Temp    Pulse 61 10/19/21 0950  Resp 13 10/19/21 0950  SpO2 100 % 10/19/21 0950  Vitals shown include unvalidated device data.  Last Pain:  Vitals:   10/19/21 0614  TempSrc: Oral         Complications: No notable events documented.

## 2021-10-20 ENCOUNTER — Encounter (HOSPITAL_COMMUNITY): Payer: Self-pay | Admitting: Orthopedic Surgery

## 2021-10-27 DIAGNOSIS — E785 Hyperlipidemia, unspecified: Secondary | ICD-10-CM | POA: Diagnosis not present

## 2021-10-27 DIAGNOSIS — Z0000 Encounter for general adult medical examination without abnormal findings: Secondary | ICD-10-CM | POA: Diagnosis not present

## 2021-11-01 DIAGNOSIS — Z96641 Presence of right artificial hip joint: Secondary | ICD-10-CM | POA: Diagnosis not present

## 2021-11-01 DIAGNOSIS — Z4789 Encounter for other orthopedic aftercare: Secondary | ICD-10-CM | POA: Diagnosis not present

## 2021-11-29 DIAGNOSIS — Z471 Aftercare following joint replacement surgery: Secondary | ICD-10-CM | POA: Diagnosis not present

## 2021-11-29 DIAGNOSIS — Z4789 Encounter for other orthopedic aftercare: Secondary | ICD-10-CM | POA: Diagnosis not present

## 2021-12-30 DIAGNOSIS — Z96649 Presence of unspecified artificial hip joint: Secondary | ICD-10-CM | POA: Diagnosis not present

## 2021-12-30 DIAGNOSIS — M87851 Other osteonecrosis, right femur: Secondary | ICD-10-CM | POA: Diagnosis not present

## 2021-12-30 DIAGNOSIS — S39012A Strain of muscle, fascia and tendon of lower back, initial encounter: Secondary | ICD-10-CM | POA: Diagnosis not present

## 2022-03-24 DIAGNOSIS — Z96641 Presence of right artificial hip joint: Secondary | ICD-10-CM | POA: Diagnosis not present

## 2022-03-24 DIAGNOSIS — Z471 Aftercare following joint replacement surgery: Secondary | ICD-10-CM | POA: Diagnosis not present

## 2022-04-28 DIAGNOSIS — R69 Illness, unspecified: Secondary | ICD-10-CM | POA: Diagnosis not present

## 2022-04-28 DIAGNOSIS — I10 Essential (primary) hypertension: Secondary | ICD-10-CM | POA: Diagnosis not present

## 2022-04-28 DIAGNOSIS — Z96641 Presence of right artificial hip joint: Secondary | ICD-10-CM | POA: Diagnosis not present

## 2022-04-28 DIAGNOSIS — M5136 Other intervertebral disc degeneration, lumbar region: Secondary | ICD-10-CM | POA: Diagnosis not present

## 2022-04-28 DIAGNOSIS — Z113 Encounter for screening for infections with a predominantly sexual mode of transmission: Secondary | ICD-10-CM | POA: Diagnosis not present

## 2022-04-28 DIAGNOSIS — E782 Mixed hyperlipidemia: Secondary | ICD-10-CM | POA: Diagnosis not present

## 2022-10-02 DIAGNOSIS — Z96641 Presence of right artificial hip joint: Secondary | ICD-10-CM | POA: Diagnosis not present

## 2023-01-26 DIAGNOSIS — E559 Vitamin D deficiency, unspecified: Secondary | ICD-10-CM | POA: Diagnosis not present

## 2023-01-26 DIAGNOSIS — E782 Mixed hyperlipidemia: Secondary | ICD-10-CM | POA: Diagnosis not present

## 2023-01-26 DIAGNOSIS — I10 Essential (primary) hypertension: Secondary | ICD-10-CM | POA: Diagnosis not present

## 2023-03-02 DIAGNOSIS — I10 Essential (primary) hypertension: Secondary | ICD-10-CM | POA: Diagnosis not present

## 2023-03-02 DIAGNOSIS — E785 Hyperlipidemia, unspecified: Secondary | ICD-10-CM | POA: Diagnosis not present

## 2023-03-11 DIAGNOSIS — J45909 Unspecified asthma, uncomplicated: Secondary | ICD-10-CM | POA: Diagnosis not present

## 2023-03-11 DIAGNOSIS — I10 Essential (primary) hypertension: Secondary | ICD-10-CM | POA: Diagnosis not present

## 2023-03-11 DIAGNOSIS — E785 Hyperlipidemia, unspecified: Secondary | ICD-10-CM | POA: Diagnosis not present

## 2023-03-11 DIAGNOSIS — Z833 Family history of diabetes mellitus: Secondary | ICD-10-CM | POA: Diagnosis not present

## 2023-03-11 DIAGNOSIS — M879 Osteonecrosis, unspecified: Secondary | ICD-10-CM | POA: Diagnosis not present

## 2023-03-11 DIAGNOSIS — Z974 Presence of external hearing-aid: Secondary | ICD-10-CM | POA: Diagnosis not present

## 2023-03-11 DIAGNOSIS — E559 Vitamin D deficiency, unspecified: Secondary | ICD-10-CM | POA: Diagnosis not present

## 2023-03-11 DIAGNOSIS — Z823 Family history of stroke: Secondary | ICD-10-CM | POA: Diagnosis not present

## 2023-06-07 DIAGNOSIS — Z23 Encounter for immunization: Secondary | ICD-10-CM | POA: Diagnosis not present

## 2023-06-07 DIAGNOSIS — I10 Essential (primary) hypertension: Secondary | ICD-10-CM | POA: Diagnosis not present

## 2023-06-07 DIAGNOSIS — E785 Hyperlipidemia, unspecified: Secondary | ICD-10-CM | POA: Diagnosis not present
